# Patient Record
Sex: Male | Born: 1995 | Race: Black or African American | Hispanic: No | Marital: Single | Smoking: Never smoker
Health system: Southern US, Community
[De-identification: ages and names within clinical notes are randomized; demographics above are authoritative.]

## PROBLEM LIST (undated history)

## (undated) DIAGNOSIS — I10 Essential (primary) hypertension: Secondary | ICD-10-CM

## (undated) DIAGNOSIS — E119 Type 2 diabetes mellitus without complications: Secondary | ICD-10-CM

## (undated) DIAGNOSIS — J45909 Unspecified asthma, uncomplicated: Secondary | ICD-10-CM

## (undated) DIAGNOSIS — E669 Obesity, unspecified: Secondary | ICD-10-CM

## (undated) DIAGNOSIS — F329 Major depressive disorder, single episode, unspecified: Secondary | ICD-10-CM

## (undated) DIAGNOSIS — F32A Depression, unspecified: Secondary | ICD-10-CM

## (undated) HISTORY — PX: APPENDECTOMY: SHX54

## (undated) HISTORY — DX: Essential (primary) hypertension: I10

---
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
## 2013-01-03 ENCOUNTER — Encounter (HOSPITAL_COMMUNITY): Payer: Self-pay | Admitting: Physical Medicine and Rehabilitation

## 2013-01-03 ENCOUNTER — Emergency Department (HOSPITAL_COMMUNITY)
Admission: EM | Admit: 2013-01-03 | Discharge: 2013-01-03 | Disposition: A | Discharge status: Home / Self Care | Payer: Medicaid Other | Attending: Emergency Medicine | Admitting: Emergency Medicine

## 2013-01-03 ENCOUNTER — Emergency Department (HOSPITAL_COMMUNITY): Payer: Medicaid Other

## 2013-01-03 DIAGNOSIS — F329 Major depressive disorder, single episode, unspecified: Secondary | ICD-10-CM | POA: Insufficient documentation

## 2013-01-03 DIAGNOSIS — Z79899 Other long term (current) drug therapy: Secondary | ICD-10-CM | POA: Insufficient documentation

## 2013-01-03 DIAGNOSIS — F3289 Other specified depressive episodes: Secondary | ICD-10-CM | POA: Insufficient documentation

## 2013-01-03 DIAGNOSIS — R61 Generalized hyperhidrosis: Secondary | ICD-10-CM | POA: Insufficient documentation

## 2013-01-03 DIAGNOSIS — R0789 Other chest pain: Secondary | ICD-10-CM | POA: Insufficient documentation

## 2013-01-03 DIAGNOSIS — R42 Dizziness and giddiness: Secondary | ICD-10-CM | POA: Insufficient documentation

## 2013-01-03 DIAGNOSIS — J45909 Unspecified asthma, uncomplicated: Secondary | ICD-10-CM | POA: Insufficient documentation

## 2013-01-03 MED ORDER — METHOCARBAMOL 500 MG PO TABS: 500.0000 mg | ORAL_TABLET | Freq: Two times a day (BID) | ORAL | Status: DC

## 2013-01-03 MED ORDER — TRAMADOL HCL 50 MG PO TABS: 50.0000 mg | ORAL_TABLET | Freq: Four times a day (QID) | ORAL | Status: DC | PRN

## 2013-01-03 NOTE — ED Notes (Signed)
Pt states understanding of discharge instructions 

## 2013-01-03 NOTE — ED Notes (Signed)
Pt presents to department for evaluation of chest pain. States he was seen x2 weeks ago for same and diagnosed with muscle tear. 4/10 pain at the time, increases with movement. Pt states he returned to work earlier than expected and thinks that he re-injured muscle. Respirations unlabored. Pt is alert and oriented x4.

## 2013-01-03 NOTE — ED Provider Notes (Signed)
History  This chart was scribed for non-physician practitioner working with Flint Melter, MD by Erskine Emery, ED Scribe. This patient was seen in room TR09C/TR09C and the patient's care was started at 17:37.   CSN: [id number]  Arrival date & time 01/03/13  1648   First MD Initiated Contact with Patient 01/03/13 1737      Chief Complaint  Patient presents with  . Chest Pain    (Consider location/radiation/quality/duration/timing/severity/associated sxs/prior treatment) The history is provided by the patient. No language interpreter was used.  Jeremy Baker is a 17 y.o. male who presents to the Emergency Department complaining of worsening chest pain with a sharp throbbing pain that shoots across the chest and through the left arm for the past 2 weeks. Pt reports some associated labored breathing due to pain; he claims last night he could only take short breaths then got light headed. He also reports some associated diaphoresis, but denies any fevers, nausea, or emesis. Pt reports the pain is aggravated by moving his left shoulder. It is mildly relieved by drinking cold fluids. Pt was here 2 weeks ago for the same, after he was lifting a particularly heavy box at work, for [employer]. He was told to take ibuprofen 800s. Pt denies any previous injury of this shoulder before 2 weeks ago. 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   Pt has no PCP.  Past Medical History  Diagnosis Date  . Asthma   . Depression     Past Surgical History  Procedure Laterality Date  . Appendectomy      History reviewed. No pertinent family history.  History  Substance Use Topics  . Smoking status: Never Smoker   . Smokeless tobacco: Not on file  . Alcohol Use: No      Review of Systems  Constitutional: Positive for diaphoresis. Negative for fever and chills.  Respiratory: Positive for shortness of breath.   Cardiovascular: Positive for chest pain.  Gastrointestinal:  Negative for nausea and vomiting.  Neurological: Positive for light-headedness. Negative for weakness.  All other systems reviewed and are negative.    Allergies  Aspirin and Sertraline  Home Medications   Current Outpatient Rx  Name  Route  Sig  Dispense  Refill  . albuterol (PROVENTIL HFA;VENTOLIN HFA) 108 (90 BASE) MCG/ACT inhaler   Inhalation   Inhale 2 puffs into the lungs every 4 (four) hours as needed. For shorntess of breath         . citalopram (CELEXA) 10 MG/5ML suspension   Oral   Take 10 mg by mouth daily.         Marland Kitchen ibuprofen (ADVIL,MOTRIN) 800 MG tablet   Oral   Take 1 tablet (800 mg total) by mouth 3 (three) times daily.   21 tablet   0     Triage Vitals: BP 145/83  Pulse 72  Temp(Src) 98.8 F (37.1 C) (Oral)  Resp 16  SpO2 97%  Physical Exam  Nursing note and vitals reviewed. Constitutional: He is oriented to person, place, and time. He appears well-developed and well-nourished. No distress.  HENT:  Head: Normocephalic and atraumatic.  Right Ear: External ear normal.  Left Ear: External ear normal.  Nose: Nose normal.  Eyes: Conjunctivae are normal.  Neck: Normal range of motion. No tracheal deviation present.  Cardiovascular: Normal rate, regular rhythm and normal heart sounds.   Pulmonary/Chest: Effort normal and breath sounds normal. No stridor.  Tenderness to palpation over left chest and  shoulder.  Abdominal: Soft. He exhibits no distension. There is no tenderness.  Musculoskeletal: Normal range of motion.  Neurological: He is alert and oriented to person, place, and time.  Skin: Skin is warm and dry. He is not diaphoretic.  Psychiatric: He has a normal mood and affect. His behavior is normal.    ED Course  Procedures (including critical care time) DIAGNOSTIC STUDIES: Oxygen Saturation is 97% on room air, adequate by my interpretation.    COORDINATION OF CARE: 18:33-I evaluated the patient and we discussed a treatment plan  including chest x-ray to which the pt agreed.    Date: 01/04/2013  Rate: 73  Rhythm: normal sinus rhythm  QRS Axis: normal  Intervals: normal  ST/T Wave abnormalities: normal  Conduction Disutrbances:none  Narrative Interpretation:   Old EKG Reviewed: unchanged     Labs Reviewed  POCT I-STAT TROPONIN I   Dg Chest 2 View  01/03/2013  *RADIOLOGY REPORT*  Clinical Data: Chest pain and shortness of breath, asthma  CHEST - 2 VIEW  Comparison: 12/18/2012  Findings: Cardiomediastinal silhouette is within normal limits. The lungs are clear. No pleural effusion.  No pneumothorax.  No acute osseous abnormality.  IMPRESSION: Normal chest.   Original Report Authenticated By: Christiana Pellant, M.D.      1. Chest pain, musculoskeletal       MDM  Patient presents today with worsening chest pain over the past 2 weeks. He lifts heavy boxes for his job. He has been evaluated for this before. He does state that the chest pain is associated with shortness of breath, diaphoresis with radiation into his left arm. For this reason I did a cardiac workup on him which was grossly negative. PERC negative. This is a musculoskeletal chest pain. He you must be on light duty at work this week in order for your muscle strain to heal. Ice, NSAIDs. Given a prescription for Robaxin. Follow up with Ortho if your pain has not improved in a week. Resource to get in to establish care with PCP in community. Vital signs are stable for discharge. Return instructions given.Patient / Family / Caregiver informed of clinical course, understand medical decision-making process, and agree with plan.       I personally performed the services described in this documentation, which was scribed in my presence. The recorded information has been reviewed and is accurate.    Mora Bellman, PA-C 01/04/13 [phone]

## 2013-01-04 NOTE — ED Provider Notes (Signed)
Medical screening examination/treatment/procedure(s) were performed by non-physician practitioner and as supervising physician I was immediately available for consultation/collaboration.  Flint Melter, MD 01/04/13 [phone]

## 2013-01-05 ENCOUNTER — Telehealth (HOSPITAL_COMMUNITY): Payer: Self-pay | Admitting: *Deleted

## 2013-01-05 NOTE — ED Notes (Signed)
Pt needed lifting restrictions for light duty added to his work note.

## 2013-05-09 ENCOUNTER — Encounter (HOSPITAL_COMMUNITY): Payer: Self-pay | Admitting: *Deleted

## 2013-05-09 ENCOUNTER — Emergency Department (HOSPITAL_COMMUNITY)
Admission: EM | Admit: 2013-05-09 | Discharge: 2013-05-09 | Disposition: A | Discharge status: Home / Self Care | Payer: Self-pay | Attending: Emergency Medicine | Admitting: Emergency Medicine

## 2013-05-09 ENCOUNTER — Emergency Department (HOSPITAL_COMMUNITY): Payer: Self-pay

## 2013-05-09 DIAGNOSIS — F3289 Other specified depressive episodes: Secondary | ICD-10-CM | POA: Insufficient documentation

## 2013-05-09 DIAGNOSIS — E669 Obesity, unspecified: Secondary | ICD-10-CM | POA: Insufficient documentation

## 2013-05-09 DIAGNOSIS — J45909 Unspecified asthma, uncomplicated: Secondary | ICD-10-CM | POA: Insufficient documentation

## 2013-05-09 DIAGNOSIS — F329 Major depressive disorder, single episode, unspecified: Secondary | ICD-10-CM | POA: Insufficient documentation

## 2013-05-09 DIAGNOSIS — R0789 Other chest pain: Secondary | ICD-10-CM | POA: Insufficient documentation

## 2013-05-09 DIAGNOSIS — Z86718 Personal history of other venous thrombosis and embolism: Secondary | ICD-10-CM | POA: Insufficient documentation

## 2013-05-09 DIAGNOSIS — Z86711 Personal history of pulmonary embolism: Secondary | ICD-10-CM | POA: Insufficient documentation

## 2013-05-09 DIAGNOSIS — Z79899 Other long term (current) drug therapy: Secondary | ICD-10-CM | POA: Insufficient documentation

## 2013-05-09 NOTE — ED Notes (Signed)
Pt reports sudden onset of sharp/burning pain to center of chest. Does not radiate. Took pepcid without relief of pain. Denies any injuries or strenuous activities that would cause pain

## 2013-05-09 NOTE — ED Provider Notes (Signed)
CSN: [id number]     Arrival date & time 05/09/13  1712 History   First MD Initiated Contact with Patient 05/09/13 1720     Chief Complaint  Patient presents with  . Chest Pain   (Consider location/radiation/quality/duration/timing/severity/associated sxs/prior Treatment) HPI  Jeremy Baker is a 17 y.o. male with past medical history significant for obesity, asthma and depression coming in complaining of a sharp and burning pain to the center of his chest onset 5 days ago, patient has been taking Pepcid at home with little relief. Pain is rated at 8/10, it is intermittent but today it became much stronger and lasted much longer period patient denies family history of early cardiac death, history of hypertension, high cholesterol, smoking, shortness of breath, cough, abdominal pain, nausea vomiting, recent immobilizations, calf pain or swelling, history of DVT or PE.   Past Medical History  Diagnosis Date  . Asthma   . Depression    Past Surgical History  Procedure Laterality Date  . Appendectomy     No family history on file. History  Substance Use Topics  . Smoking status: Never Smoker   . Smokeless tobacco: Not on file  . Alcohol Use: No    Review of Systems 10 systems reviewed and found to be negative, except as noted in the HPI   Allergies  Aspirin and Sertraline  Home Medications   Current Outpatient Rx  Name  Route  Sig  Dispense  Refill  . albuterol (PROVENTIL HFA;VENTOLIN HFA) 108 (90 BASE) MCG/ACT inhaler   Inhalation   Inhale 2 puffs into the lungs every 4 (four) hours as needed. For shorntess of breath         . citalopram (CELEXA) 10 MG/5ML suspension   Oral   Take 10 mg by mouth daily.         . famotidine (PEPCID) 20 MG tablet   Oral   Take 20 mg by mouth 2 (two) times daily.          BP 137/81  Pulse 76  Temp(Src) 98.2 F (36.8 C) (Oral)  Resp 16  SpO2 97% Physical Exam  Nursing note and vitals reviewed. Constitutional: He is oriented  to person, place, and time. He appears well-developed and well-nourished. No distress.  Obese  HENT:  Head: Normocephalic.  Mouth/Throat: Oropharynx is clear and moist.  Eyes: Conjunctivae and EOM are normal.  Neck: Normal range of motion. No JVD present.  Cardiovascular: Normal rate, regular rhythm and intact distal pulses.   Pulmonary/Chest: Effort normal. No stridor. No respiratory distress. He has no wheezes. He has no rales. He exhibits tenderness.  Patient is very tender to palpation on the sternum  Abdominal: Soft. There is no tenderness.  Musculoskeletal: Normal range of motion. He exhibits no edema.  No calf asymmetry, superficial collaterals, palpable cords, edema, Homans sign negative bilaterally.    Neurological: He is alert and oriented to person, place, and time.  Psychiatric: He has a normal mood and affect.    ED Course  Procedures (including critical care time) Labs Review Labs Reviewed - No data to display Imaging Review Dg Chest 2 View  05/09/2013   *RADIOLOGY REPORT*  Clinical Data:  Chest pain and shortness of breath.  History of asthma and diabetes.  CHEST - 2 VIEW  Comparison: 01/03/2013.  Findings: The heart size and mediastinal contours are within normal limits.  Both lungs are clear.  The visualized skeletal structures are unremarkable.  IMPRESSION: No active disease.   Original Report  Authenticated By: Irish Lack, M.D.    Date: 05/09/2013  Rate: 87  Rhythm: normal sinus rhythm  QRS Axis: normal  Intervals: normal  ST/T Wave abnormalities: nonspecific T wave changes  Conduction Disutrbances:none  Narrative Interpretation:   Old EKG Reviewed: unchanged    MDM   1. Atypical chest pain     Filed Vitals:   05/09/13 1724 05/09/13 1811  BP: 127/75 137/81  Pulse: 91 76  Temp: 98.2 F (36.8 C)   TempSrc: Oral   Resp: 18 16  SpO2: 98% 97%     Jeremy Baker is a 17 y.o. male Patient is to be discharged with recommendation to follow up with  PCP in regards to today's hospital visit. Chest pain is not likely of cardiac or pulmonary etiology d/t presentation, perc negative, VSS, no tracheal deviation, no JVD or new murmur, RRR, breath sounds equal bilaterally, EKG without acute abnormalities, negative troponin, and negative CXR. Pt has been advised start a PPI and return to the ED is CP becomes exertional, associated with diaphoresis or nausea, radiates to left jaw/arm, worsens or becomes concerning in any way. Pt appears reliable for follow up and is agreeable to discharge. Pt verbalized understanding and agrees with care plan. All questions answered. Outpatient follow-up and specific return precautions discussed.    Note: Portions of this report may have been transcribed using voice recognition software. Every effort was made to ensure accuracy; however, inadvertent computerized transcription errors may be present      Wynetta Emery, PA-C 05/10/13 1716

## 2013-05-12 LAB — POCT I-STAT, CHEM 8
Calcium, Ion: 1.1 mmol/L — ABNORMAL LOW (ref 1.12–1.23)
Creatinine, Ser: 1.2 mg/dL (ref 0.50–1.35)
Hemoglobin: 15.6 g/dL (ref 13.0–17.0)
Sodium: 143 mEq/L (ref 135–145)
TCO2: 23 mmol/L (ref 0–100)

## 2013-05-12 LAB — POCT I-STAT TROPONIN I

## 2013-05-15 NOTE — ED Provider Notes (Signed)
Medical screening examination/treatment/procedure(s) were performed by non-physician practitioner and as supervising physician I was immediately available for consultation/collaboration. Devoria Albe, MD, FACEP   Ward Givens, MD 05/15/13 [phone]

## 2014-08-20 ENCOUNTER — Encounter (HOSPITAL_COMMUNITY): Payer: Self-pay | Admitting: Emergency Medicine

## 2014-08-20 ENCOUNTER — Emergency Department (HOSPITAL_COMMUNITY)
Admission: EM | Admit: 2014-08-20 | Discharge: 2014-08-21 | Disposition: A | Discharge status: Home / Self Care | Payer: No Typology Code available for payment source | Attending: Emergency Medicine | Admitting: Emergency Medicine

## 2014-08-20 DIAGNOSIS — F329 Major depressive disorder, single episode, unspecified: Secondary | ICD-10-CM | POA: Insufficient documentation

## 2014-08-20 DIAGNOSIS — E119 Type 2 diabetes mellitus without complications: Secondary | ICD-10-CM | POA: Insufficient documentation

## 2014-08-20 DIAGNOSIS — Z79899 Other long term (current) drug therapy: Secondary | ICD-10-CM | POA: Insufficient documentation

## 2014-08-20 DIAGNOSIS — M546 Pain in thoracic spine: Secondary | ICD-10-CM | POA: Insufficient documentation

## 2014-08-20 DIAGNOSIS — E669 Obesity, unspecified: Secondary | ICD-10-CM | POA: Insufficient documentation

## 2014-08-20 DIAGNOSIS — J45909 Unspecified asthma, uncomplicated: Secondary | ICD-10-CM | POA: Insufficient documentation

## 2014-08-20 HISTORY — DX: Obesity, unspecified: E66.9

## 2014-08-20 HISTORY — DX: Type 2 diabetes mellitus without complications: E11.9

## 2014-08-20 MED ORDER — MORPHINE SULFATE 4 MG/ML IJ SOLN
4.0000 mg | Freq: Once | INTRAMUSCULAR | Status: AC
  Administered 2014-08-20: 4 mg via INTRAMUSCULAR
  Filled 2014-08-20: qty 1

## 2014-08-20 MED ORDER — CYCLOBENZAPRINE HCL 10 MG PO TABS: 10.0000 mg | ORAL_TABLET | Freq: Two times a day (BID) | ORAL | Status: DC | PRN

## 2014-08-20 MED ORDER — DIAZEPAM 5 MG PO TABS
5.0000 mg | ORAL_TABLET | Freq: Once | ORAL | Status: AC
  Administered 2014-08-20: 5 mg via ORAL
  Filled 2014-08-20: qty 1

## 2014-08-20 MED ORDER — OXYCODONE-ACETAMINOPHEN 5-325 MG PO TABS
1.0000 | ORAL_TABLET | Freq: Once | ORAL | Status: AC
  Administered 2014-08-20: 1 via ORAL
  Filled 2014-08-20: qty 1

## 2014-08-20 MED ORDER — OXYCODONE-ACETAMINOPHEN 5-325 MG PO TABS: 1.0000 | ORAL_TABLET | Freq: Four times a day (QID) | ORAL | Status: DC | PRN

## 2014-08-20 NOTE — ED Notes (Signed)
Pt. reports low back pain radiating to mid back onset this evening while at work ( [employer] ) , denies injury or strenuous  activity , ambulatory / no urinary discomfort .

## 2014-08-20 NOTE — Discharge Instructions (Signed)
Back Pain, Adult Low back pain is very common. About 1 in 5 people have back pain.The cause of low back pain is rarely dangerous. The pain often gets better over time.About half of people with a sudden onset of back pain feel better in just 2 weeks. About 8 in 10 people feel better by 6 weeks.  CAUSES Some common causes of back pain include:  Strain of the muscles or ligaments supporting the spine.  Wear and tear (degeneration) of the spinal discs.  Arthritis.  Direct injury to the back. DIAGNOSIS Most of the time, the direct cause of low back pain is not known.However, back pain can be treated effectively even when the exact cause of the pain is unknown.Answering your caregiver's questions about your overall health and symptoms is one of the most accurate ways to make sure the cause of your pain is not dangerous. If your caregiver needs more information, he or she may order lab work or imaging tests (X-rays or MRIs).However, even if imaging tests show changes in your back, this usually does not require surgery. HOME CARE INSTRUCTIONS For many people, back pain returns.Since low back pain is rarely dangerous, it is often a condition that people can learn to manageon their own.   Remain active. It is stressful on the back to sit or stand in one place. Do not sit, drive, or stand in one place for more than 30 minutes at a time. Take short walks on level surfaces as soon as pain allows.Try to increase the length of time you walk each day.  Do not stay in bed.Resting more than 1 or 2 days can delay your recovery.  Do not avoid exercise or work.Your body is made to move.It is not dangerous to be active, even though your back may hurt.Your back will likely heal faster if you return to being active before your pain is gone.  Pay attention to your body when you bend and lift. Many people have less discomfortwhen lifting if they bend their knees, keep the load close to their bodies,and  avoid twisting. Often, the most comfortable positions are those that put less stress on your recovering back.  Find a comfortable position to sleep. Use a firm mattress and lie on your side with your knees slightly bent. If you lie on your back, put a pillow under your knees.  Only take over-the-counter or prescription medicines as directed by your caregiver. Over-the-counter medicines to reduce pain and inflammation are often the most helpful.Your caregiver may prescribe muscle relaxant drugs.These medicines help dull your pain so you can more quickly return to your normal activities and healthy exercise.  Put ice on the injured area.  Put ice in a plastic bag.  Place a towel between your skin and the bag.  Leave the ice on for 15-20 minutes, 03-04 times a day for the first 2 to 3 days. After that, ice and heat may be alternated to reduce pain and spasms.  Ask your caregiver about trying back exercises and gentle massage. This may be of some benefit.  Avoid feeling anxious or stressed.Stress increases muscle tension and can worsen back pain.It is important to recognize when you are anxious or stressed and learn ways to manage it.Exercise is a great option. SEEK MEDICAL CARE IF:  You have pain that is not relieved with rest or medicine.  You have pain that does not improve in 1 week.  You have new symptoms.  You are generally not feeling well. SEEK   IMMEDIATE MEDICAL CARE IF:   You have pain that radiates from your back into your legs.  You develop new bowel or bladder control problems.  You have unusual weakness or numbness in your arms or legs.  You develop nausea or vomiting.  You develop abdominal pain.  You feel faint. Document Released: 08/27/2005 Document Revised: 02/26/2012 Document Reviewed: 12/29/2013 Exit[employer] Patient Information 2015 [employer]. This information is not intended to replace advice given to you by your health care provider. Make sure you  discuss any questions you have with your health care provider.  

## 2014-08-20 NOTE — ED Provider Notes (Signed)
CSN: [id number]     Arrival date & time 08/20/14  2015 History  This chart was scribed for non-physician practitioner, Marlon Peliffany Krzysztof Reichelt, PA-C,working with Rolan BuccoMelanie Belfi, MD, by Karle PlumberJennifer Tensley, ED Scribe. This patient was seen in room TR07C/TR07C and the patient's care was started at 10:20 PM.  Chief Complaint  Patient presents with  . Back Pain   Patient is a 18 y.o. male presenting with back pain. The history is provided by the patient. No language interpreter was used.  Back Pain Associated symptoms: no numbness and no weakness     HPI Comments:  Jeremy Baker is a 18 y.o. obese male who presents to the Emergency Department complaining of new onset, severe lower back pain that began earlier today. He states the pain feels like an "electrical shock" that radiates from the lower back to his upper back. He reports the pain made him drop to his knees. He denies taking anything for the pain PTA. Pt states he fell on his hands and knees about two weeks ago but denies back pain secondary to that and has not had back pain like this before. Walking makes the pain worse. Denies alleviating factors. Denies heavy lifting, new shoes, numbness, tingling or weakness of the lower extremities. Denies trauma or injury. Maternal grandmother had a pace maker placed in her thirties due to heart failure. PMH of asthma, depression and DM. Reports allergy to ASA stating it thins his blood too much causing epistaxis.  Past Medical History  Diagnosis Date  . Asthma   . Depression   . Diabetes mellitus without complication   . Obesity    Past Surgical History  Procedure Laterality Date  . Appendectomy     No family history on file. History  Substance Use Topics  . Smoking status: Never Smoker   . Smokeless tobacco: Not on file  . Alcohol Use: No    Review of Systems  Gastrointestinal: Negative for nausea and vomiting.  Musculoskeletal: Positive for back pain.  Neurological: Negative for weakness and  numbness.  All other systems reviewed and are negative.   Allergies  Aspirin and Sertraline  Home Medications   Prior to Admission medications   Medication Sig Start Date End Date Taking? Authorizing Provider  citalopram (CELEXA) 10 MG tablet Take 10 mg by mouth daily.   Yes Historical Provider, MD  albuterol (PROVENTIL HFA;VENTOLIN HFA) 108 (90 BASE) MCG/ACT inhaler Inhale 2 puffs into the lungs every 4 (four) hours as needed for shortness of breath.     Historical Provider, MD  cyclobenzaprine (FLEXERIL) 10 MG tablet Take 1 tablet (10 mg total) by mouth 2 (two) times daily as needed for muscle spasms. 08/20/14   Reed Eifert Irine SealG Sargon Scouten, PA-C  oxyCODONE-acetaminophen (PERCOCET/ROXICET) 5-325 MG per tablet Take 1-2 tablets by mouth every 6 (six) hours as needed. 08/20/14   Dorthula Matasiffany G Korynn Kenedy, PA-C   Triage Vitals: BP 152/82 mmHg  Pulse 88  Temp(Src) 98.1 F (36.7 C) (Oral)  Resp 16  Ht 6\' 2"  (1.88 m)  Wt 365 lb 8 oz (165.79 kg)  BMI 46.91 kg/m2  SpO2 97% Physical Exam  Constitutional: He is oriented to person, place, and time. He appears well-developed and well-nourished.  HENT:  Head: Normocephalic and atraumatic.  Eyes: EOM are normal.  Neck: Normal range of motion.  Cardiovascular: Normal rate.   Pulmonary/Chest: Effort normal.  Musculoskeletal: Normal range of motion.       Arms: Pt has equal strength to bilateral lower extremities.  Neurosensory function  adequate to both legs No clonus on dorsiflextion Skin color is normal. Skin is warm and moist.  I see no step off deformity, no midline bony tenderness.  Pt is able to ambulate.  No crepitus, laceration, effusion, induration, lesions, swelling.   Pedal pulses are symmetrical and palpable bilaterally  Skin is very sensitive to touch. tenderness to palpation of paraspinel muscles   Neurological: He is alert and oriented to person, place, and time.  Skin: Skin is warm and dry.  Psychiatric: He has a normal mood and affect. His  behavior is normal.  Nursing note and vitals reviewed.   ED Course  Procedures (including critical care time) DIAGNOSTIC STUDIES: Oxygen Saturation is 97% on RA, normal by my interpretation.   COORDINATION OF CARE: 10:27 PM- Will order pain medication and muscle relaxer and reevaluate. Pt verbalizes understanding and agrees to plan.  Medications  diazepam (VALIUM) tablet 5 mg (5 mg Oral Given 08/20/14 2246)  morphine 4 MG/ML injection 4 mg (4 mg Intramuscular Given 08/20/14 2246)  oxyCODONE-acetaminophen (PERCOCET/ROXICET) 5-325 MG per tablet 1 tablet (1 tablet Oral Given 08/20/14 2354)    Labs Review Labs Reviewed - No data to display  Imaging Review No results found.   EKG Interpretation None      MDM   Final diagnoses:  Left-sided thoracic back pain   Medications  diazepam (VALIUM) tablet 5 mg (5 mg Oral Given 08/20/14 2246)  morphine 4 MG/ML injection 4 mg (4 mg Intramuscular Given 08/20/14 2246)  oxyCODONE-acetaminophen (PERCOCET/ROXICET) 5-325 MG per tablet 1 tablet (1 tablet Oral Given 08/20/14 2354)    The pain significantly improved the patients pain. He is not able to tolerate bending and his back muscles being touched. He is walking without a limp.  18 y.o.Jeremy Baker's  with back pain. No neurological deficits and normal neuro exam. Patient can walk. No loss of bowel or bladder control. No concern for cauda equina at this time base on HPI and physical exam findings. No fever, night sweats, weight loss, h/o cancer, IVDU.  The patient has no numbness or tingling to lower legs, no weakness. No midline tenderness, it lateralizes to the left.  RICE protocol and pain medicine indicated and discussed with patient.   Patient Plan 1. Medications: pain medication and muscle relaxer. Cont usual home medications unless otherwise directed. 2. Treatment: rest, drink plenty of fluids, gentle stretching as discussed, alternate ice and heat  3. Follow Up: Please  followup with your primary doctor for discussion of your diagnoses and further evaluation after today's visit; if you do not have a primary care doctor use the resource guide provided to find one  Advised to follow-up with the orthopedist if symptoms do not start to resolve in the next 2-3 days. If develop loss of bowel or urinary control return to the ED as soon as possible for further evaluation. To take the medications as prescribed as they can cause harm if not taken appropriately.   Vital signs are stable at discharge. Filed Vitals:   08/20/14 2358  BP: 134/88  Pulse: 83  Temp: 98 F (36.7 C)  Resp: 18    Patient/guardian has voiced understanding and agreed to follow-up with the PCP or specialist.       I personally performed the services described in this documentation, which was scribed in my presence. The recorded information has been reviewed and is accurate.    Dorthula Matasiffany G Cylis Ayars, PA-C 08/23/14 1421  Rolan BuccoMelanie Belfi, MD 08/23/14 [phone]

## 2017-09-01 ENCOUNTER — Encounter (HOSPITAL_COMMUNITY): Payer: Self-pay | Admitting: Emergency Medicine

## 2017-09-01 ENCOUNTER — Other Ambulatory Visit: Payer: Self-pay

## 2017-09-01 ENCOUNTER — Emergency Department (HOSPITAL_COMMUNITY): Payer: Managed Care, Other (non HMO)

## 2017-09-01 ENCOUNTER — Emergency Department (HOSPITAL_COMMUNITY)
Admission: EM | Admit: 2017-09-01 | Discharge: 2017-09-01 | Disposition: A | Discharge status: Home / Self Care | Payer: Managed Care, Other (non HMO) | Attending: Emergency Medicine | Admitting: Emergency Medicine

## 2017-09-01 DIAGNOSIS — R079 Chest pain, unspecified: Secondary | ICD-10-CM | POA: Insufficient documentation

## 2017-09-01 DIAGNOSIS — J45909 Unspecified asthma, uncomplicated: Secondary | ICD-10-CM | POA: Insufficient documentation

## 2017-09-01 DIAGNOSIS — Z79899 Other long term (current) drug therapy: Secondary | ICD-10-CM | POA: Diagnosis not present

## 2017-09-01 DIAGNOSIS — R0789 Other chest pain: Secondary | ICD-10-CM

## 2017-09-01 LAB — CBC
HCT: 44 % (ref 39.0–52.0)
Hemoglobin: 14.2 g/dL (ref 13.0–17.0)
MCH: 27 pg (ref 26.0–34.0)
MCHC: 32.3 g/dL (ref 30.0–36.0)
MCV: 83.7 fL (ref 78.0–100.0)
Platelets: 257 10*3/uL (ref 150–400)
RBC: 5.26 MIL/uL (ref 4.22–5.81)
RDW: 13.6 % (ref 11.5–15.5)
WBC: 8 10*3/uL (ref 4.0–10.5)

## 2017-09-01 LAB — BASIC METABOLIC PANEL
Anion gap: 8 (ref 5–15)
BUN: 9 mg/dL (ref 6–20)
CHLORIDE: 110 mmol/L (ref 101–111)
CO2: 22 mmol/L (ref 22–32)
Calcium: 9 mg/dL (ref 8.9–10.3)
Creatinine, Ser: 1.08 mg/dL (ref 0.61–1.24)
GFR calc Af Amer: >60 mL/min (ref >60)
GFR calc non Af Amer: >60 mL/min (ref >60)
Glucose, Bld: 97 mg/dL (ref 65–99)
Potassium: 3.6 mmol/L (ref 3.5–5.1)
Sodium: 140 mmol/L (ref 135–145)

## 2017-09-01 LAB — I-STAT TROPONIN, ED
Troponin i, poc: 0 ng/mL (ref 0.00–0.08)
Troponin i, poc: 0 ng/mL (ref 0.00–0.08)

## 2017-09-01 MED ORDER — ACETAMINOPHEN 500 MG PO TABS
1000.0000 mg | ORAL_TABLET | Freq: Once | ORAL | Status: AC
  Administered 2017-09-01: 1000 mg via ORAL
  Filled 2017-09-01: qty 2

## 2017-09-01 MED ORDER — IPRATROPIUM-ALBUTEROL 0.5-2.5 (3) MG/3ML IN SOLN
3.0000 mL | Freq: Once | RESPIRATORY_TRACT | Status: AC
  Administered 2017-09-01: 3 mL via RESPIRATORY_TRACT
  Filled 2017-09-01: qty 3

## 2017-09-01 NOTE — ED Triage Notes (Signed)
Ems- Pt reports chx pain to the center of chest, sharp in nature with occasional radiation to the shoulder. No ASA given due to allergy. 2 nitro PTA. Vitals stable. NSR with abnormalities. NAD at triage.

## 2017-09-01 NOTE — ED Provider Notes (Signed)
[facility] EMERGENCY DEPARTMENT Provider Note   CSN: [id number] Arrival date & time: 09/01/17  1342     History   Chief Complaint Chief Complaint  Patient presents with  . Chest Pain    HPI Jeremy Baker is a 21 y.o. male w/ h/o childhood asthma, obesity, pre-diabetes, borderline HTN presents for evaluation of sudden, constant, sharp, substernal chest pain that began 2 hours ago while he was working at [employer]. Radiating to epigastric area. Associated with shortness of breath that he describes as "pain took my breath away", but this has resolved. Also having a dry cough x 2 days with itchy throat, thinks he is getting a cold. Given nitro x 2 via EMS which minimally reduced his CP from 7/10 to 5/10. Reports h/o previous "muscle tears" across his chest from shoulder to shoulder from previous trauma but today's symptoms are different.   No nausea, vomiting, diaphoresis, palpitations, LE edema, h/o DVT/PE. No tobacco, ETOH or illicit drug use. No family h/o young onset CAD.   HPI  Past Medical History:  Diagnosis Date  . Asthma   . Depression   . Obesity     There are no active problems to display for this patient.   Past Surgical History:  Procedure Laterality Date  . APPENDECTOMY         Home Medications    Prior to Admission medications   Medication Sig Start Date End Date Taking? Authorizing Provider  albuterol (PROVENTIL HFA;VENTOLIN HFA) 108 (90 BASE) MCG/ACT inhaler Inhale 2 puffs into the lungs every 4 (four) hours as needed for shortness of breath.    Yes [provider]  lamoTRIgine (LAMICTAL) 25 MG tablet Take 25 mg by mouth daily. 06/14/16  Yes [provider]  topiramate (TOPAMAX) 50 MG tablet Take 100 mg by mouth daily.  06/14/16  Yes [provider]  citalopram (CELEXA) 10 MG tablet Take 10 mg by mouth daily.    [provider]  cyclobenzaprine (FLEXERIL) 10 MG tablet Take 1 tablet  (10 mg total) by mouth 2 (two) times daily as needed for muscle spasms. Patient not taking: Reported on 09/01/2017 08/20/14   Marlon Pel, PA-C  oxyCODONE-acetaminophen (PERCOCET/ROXICET) 5-325 MG per tablet Take 1-2 tablets by mouth every 6 (six) hours as needed. Patient not taking: Reported on 09/01/2017 08/20/14   Marlon Pel, PA-C    Family History No family history on file.  Social History Social History   Tobacco Use  . Smoking status: Never Smoker  Substance Use Topics  . Alcohol use: No  . Drug use: No     Allergies   Aspirin and Sertraline   Review of Systems Review of Systems  Respiratory: Positive for cough, chest tightness and shortness of breath (resolved).   Cardiovascular: Positive for chest pain.  All other systems reviewed and are negative.    Physical Exam Updated Vital Signs BP 131/70 (BP Location: Right Arm)   Pulse 72   Temp 98.5 F (36.9 C) (Oral)   Resp 16   SpO2 99%   Physical Exam  Constitutional: He appears well-developed and well-nourished.  Obese male. NAD. Non toxic.   HENT:  Head: Normocephalic and atraumatic.  Nose: Nose normal.  Moist mucous membranes Tonsils and oropharynx normal  Eyes: Conjunctivae, EOM and lids are normal.  Neck: Trachea normal and normal range of motion.  Neck is supple Trachea midline No cervical adenopathy  Cardiovascular: Normal rate, regular rhythm, S1 normal, S2 normal and  normal heart sounds.  Pulses:      Carotid pulses are 2+ on the right side, and 2+ on the left side.      Radial pulses are 2+ on the right side, and 2+ on the left side.       Dorsalis pedis pulses are 2+ on the right side, and 2+ on the left side.  RRR No S3 No orthopnea No LE edema No carotid bruits  Pulmonary/Chest: Effort normal and breath sounds normal. No respiratory distress. He has no decreased breath sounds. He has no wheezes. He has no rhonchi. He has no rales. He exhibits tenderness.  +Central, sternal  chest wall tenderness. Reproducible with AROM of upper extremities against resistance.     Abdominal: Soft. Bowel sounds are normal. There is no tenderness.  No epigastric tenderness  Lymphadenopathy:    He has no cervical adenopathy.  Neurological: He is alert. GCS eye subscore is 4. GCS verbal subscore is 5. GCS motor subscore is 6.  Skin: Skin is warm and dry. Capillary refill takes less than 2 seconds.  Psychiatric: He has a normal mood and affect. His speech is normal and behavior is normal. Judgment and thought content normal. Cognition and memory are normal.     ED Treatments / Results  Labs (all labs ordered are listed, but only abnormal results are displayed) Labs Reviewed  BASIC METABOLIC PANEL  CBC  I-STAT TROPONIN, ED  I-STAT TROPONIN, ED    EKG  EKG Interpretation None       Radiology Dg Chest 2 View  Result Date: 09/01/2017 CLINICAL DATA:  Pt reports he was at work today and began having a sharp, burning pain in the center of his chest and some shortness of breath. Hx of asthma as a child. Non-smoker EXAM: CHEST  2 VIEW COMPARISON:  05/09/2013 FINDINGS: The heart size and mediastinal contours are within normal limits. Both lungs are clear. No pleural effusion or pneumothorax. The visualized skeletal structures are unremarkable. IMPRESSION: Normal chest radiographs. Electronically Signed   By: Amie Portlandavid  Ormond M.D.   On: 09/01/2017 14:38    Procedures Procedures (including critical care time)  Medications Ordered in ED Medications  ipratropium-albuterol (DUONEB) 0.5-2.5 (3) MG/3ML nebulizer solution 3 mL (3 mLs Nebulization Given 09/01/17 1519)  acetaminophen (TYLENOL) tablet 1,000 mg (1,000 mg Oral Given 09/01/17 1518)     Initial Impression / Assessment and Plan / ED Course  I have reviewed the triage vital signs and the nursing notes.  Pertinent labs & imaging results that were available during my care of the patient were reviewed by me and considered in  my medical decision making (see chart for details).    Pt is a 21 y.o. male presents with CP described as sharp and constant. Non exertional, non pleuritic and non positional. It is reproducible with palpation. Slightly improved with nitro, but more responsive to tylenol and duoneb.  Pertinent risk factors include obesity and pre-diabetes.  On exam VS are wnl. Cardiovascular and pulmonary exam benign. CXR, EKG, troponin x 2 within normal limits.  CBC and BMP unremarkable.  PERC negative. Heart score = 2-3 based on risk factors.  Patient has ambulated and tolerated PO in ED. Given symptoms, reassuring ED work up, low risk HEART score patient will be discharged with recommendation to follow up with PCP and cardiologist in regards to today's hospital visit and atypical CP. ED return preacutions given. Pt appears reliable for follow up and is agreeable to discharge.   Final  Clinical Impressions(s) / ED Diagnoses   Final diagnoses:  Atypical chest pain    ED Discharge Orders    None       Jerrell MylarGibbons, Tinie Mcgloin J, PA-C 09/01/17 1819    Benjiman CorePickering, Nathan, MD 09/02/17 2018

## 2017-09-01 NOTE — ED Notes (Signed)
Patient verbalized understanding of discharge instructions and denies any further needs or questions at this time. VS stable. Patient ambulatory with steady gait.  

## 2017-09-01 NOTE — Discharge Instructions (Signed)
Your labs, chest x-ray, EKG and heart enzymes were ok.   You have some risk factors for early onset heart disease including weight, pre-diabetes, pre-high blood pressure. You need to follow up with a cardiologist for further outpatient work up.  Return to emergency department if you develop exertional chest pain or shortness of breath, palpitations, nausea, vomiting, light-headedness, fevers, cough

## 2017-10-15 ENCOUNTER — Ambulatory Visit (INDEPENDENT_AMBULATORY_CARE_PROVIDER_SITE_OTHER): Payer: Managed Care, Other (non HMO) | Admitting: Cardiovascular Disease

## 2017-10-15 ENCOUNTER — Encounter: Payer: Self-pay | Admitting: Cardiovascular Disease

## 2017-10-15 VITALS — BP 132/75 | HR 77 | Resp 16 | Ht 76.0 in | Wt 377.0 lb

## 2017-10-15 DIAGNOSIS — Z13220 Encounter for screening for lipoid disorders: Secondary | ICD-10-CM

## 2017-10-15 DIAGNOSIS — I10 Essential (primary) hypertension: Secondary | ICD-10-CM | POA: Diagnosis not present

## 2017-10-15 DIAGNOSIS — R0789 Other chest pain: Secondary | ICD-10-CM

## 2017-10-15 DIAGNOSIS — Z79899 Other long term (current) drug therapy: Secondary | ICD-10-CM

## 2017-10-15 DIAGNOSIS — Z1329 Encounter for screening for other suspected endocrine disorder: Secondary | ICD-10-CM

## 2017-10-15 DIAGNOSIS — Z7189 Other specified counseling: Secondary | ICD-10-CM

## 2017-10-15 NOTE — Patient Instructions (Signed)
Medication Instructions:  Your physician recommends that you continue on your current medications as directed. Please refer to the Current Medication list given to you today.  Labwork: Please return for FASTING labs (CMET, Lipid, TSH)-lab orders provided.  Testing/Procedures: Your physician has requested that you have an exercise tolerance test. For further information please visit [email or web address]. Please also follow instruction sheet, as given.  Your physician has requested that you have an echocardiogram. Echocardiography is a painless test that uses sound waves to create images of your heart. It provides your doctor with information about the size and shape of your heart and how well your heart's chambers and valves are working. This procedure takes approximately one hour. There are no restrictions for this procedure.  [facility]  [facility]  Follow-Up: 3 months with Dr. Tresa EndoKelly.   Any Other Special Instructions Will Be Listed Below (If Applicable).     If you need a refill on your cardiac medications before your next appointment, please call your pharmacy.

## 2017-10-15 NOTE — Progress Notes (Signed)
Cardiology Office Note    Date:  10/20/2017   ID:  Jeremy Baker, DOB 1996-08-30, MRN [id number]  PCP:  Patient, No Pcp Per  Cardiologist:  Shelva Majestic, MD   Chief Complaint  Patient presents with  . New Patient (Initial Visit)    chest pain   Cardiology consultation, referred through the [facility] emergency room regarding chest pain  History of Present Illness:  Jeremy Baker is a 22 y.o. male who is referred for cardiology evaluation after a recent coronary emergency room evaluation for chest pain.  Jeremy Baker has a long-standing history of morbid obesity.  He tells me that his peak weight was 420 pounds.  He works at [employer] and before Christmas he was working very hard lifting, moving, and started to develop persistent sharp, stabbing like chest pain.  He denied any definitive chest pain, development with walking but noted this when he was exceptionally busy while at work.  He presented to the emergency room on 09/01/2017 for evaluation of a "sudden, constant, sharp, substernal chest pain that began 2 hours previously, while working, Arts administrator at [employer].  "The pain radiated to his epigastric area and was associated with mild shortness of breath, which took his breath away for short duration.  He also admitted to having a dry cough for several days.  He was given several nitroglycerin by EMS with plus minus benefit.  He had reported previous "muscle tears "across his chest from shoulder to shoulder from previous trauma, but those symptoms were different.  His chest pain was felt to be atypical.  However, he is now referred for cardiology evaluation.  The patient states that he has felt better with reference to this discomfort.  He has lost approximately 30 pounds by reducing his sugar intake in sodas.  He does not routinely exercise.  There is family history for diabetes mellitus in his father had developed kidney failure.  He presents for evaluation.   Past Medical  History:  Diagnosis Date  . Asthma   . Depression   . Diabetes mellitus without complication ([place])   . Hypertension   . Obesity     Past Surgical History:  Procedure Laterality Date  . APPENDECTOMY      Current Medications: Outpatient Medications Prior to Visit  Medication Sig Dispense Refill  . lamoTRIgine (LAMICTAL) 25 MG tablet Take 25 mg by mouth daily.    Marland Kitchen topiramate (TOPAMAX) 50 MG tablet Take 100 mg by mouth daily.     Marland Kitchen albuterol (PROVENTIL HFA;VENTOLIN HFA) 108 (90 BASE) MCG/ACT inhaler Inhale 2 puffs into the lungs every 4 (four) hours as needed for shortness of breath.     . citalopram (CELEXA) 10 MG tablet Take 10 mg by mouth daily.    . cyclobenzaprine (FLEXERIL) 10 MG tablet Take 1 tablet (10 mg total) by mouth 2 (two) times daily as needed for muscle spasms. (Patient not taking: Reported on 09/01/2017) 20 tablet 0  . oxyCODONE-acetaminophen (PERCOCET/ROXICET) 5-325 MG per tablet Take 1-2 tablets by mouth every 6 (six) hours as needed. (Patient not taking: Reported on 09/01/2017) 25 tablet 0   No facility-administered medications prior to visit.      Allergies:   Aspirin and Sertraline   Social History   Socioeconomic History  . Marital status: Single    Spouse name: None  . Number of children: None  . Years of education: None  . Highest education level: None  Social Needs  . Financial resource strain: None  .  Food insecurity - worry: None  . Food insecurity - inability: None  . Transportation needs - medical: None  . Transportation needs - non-medical: None  Occupational History  . None  Tobacco Use  . Smoking status: Never Smoker  . Smokeless tobacco: Never Used  Substance and Sexual Activity  . Alcohol use: No  . Drug use: No  . Sexual activity: None  Other Topics Concern  . None  Social History Narrative  . None     Family History:  The patient's  family history includes Cancer in his maternal grandfather; Diabetes in his father and  mother; Heart disease in his maternal grandmother and mother; Hypertension in his mother; Kidney failure in his father and paternal grandfather.   ROS General: Negative; No fevers, chills, or night sweats; morbid obesity HEENT: Negative; No changes in vision or hearing, sinus congestion, difficulty swallowing Pulmonary: Negative; No cough, wheezing, shortness of breath, hemoptysis Cardiovascular: No exertional chest pain symptoms.  No PND, orthopnea.  No presyncope or syncope. GI: Negative; No nausea, vomiting, diarrhea, or abdominal pain GU: Negative; No dysuria, hematuria, or difficulty voiding Musculoskeletal: Negative; no myalgias, joint pain, or weakness Hematologic/Oncology: Negative; no easy bruising, bleeding Endocrine: Negative; no heat/cold intolerance; no diabetes Neuro: Negative; no changes in balance, headaches Skin: Negative; No rashes or skin lesions Psychiatric: Negative; No behavioral problems, depression Sleep: Negative; No snoring, daytime sleepiness, hypersomnolence, bruxism, restless legs, hypnogognic hallucinations, no cataplexy Other comprehensive 14 point system review is negative.   PHYSICAL EXAM:   VS:  BP 132/75   Pulse 77   Resp 16   Ht '6\' 4"'  (1.93 m)   Wt (!) 377 lb (171 kg)   SpO2 98%   BMI 45.89 kg/m     Repeat blood pressure by me was 122/76  Wt Readings from Last 3 Encounters:  10/15/17 (!) 377 lb (171 kg)  08/20/14 (!) 365 lb 8 oz (165.8 kg)  11/20/11 (!) 335 lb 1.6 oz (152 kg) (>99 %, Z= 3.39)*   * Growth percentiles are based on CDC (Boys, 2-20 Years) data.    General: Alert, oriented, no distress.  Skin: normal turgor, no rashes, warm and dry HEENT: Normocephalic, atraumatic. Pupils equal round and reactive to light; sclera anicteric; extraocular muscles intact; Fundi normal Nose without nasal septal hypertrophy Mouth/Parynx benign; Mallinpatti scale 3 Neck: Thick neck; No JVD, no carotid bruits; normal carotid upstroke Lungs: clear to  ausculatation and percussion; no wheezing or rales Chest wall: without tenderness to palpitation Heart: PMI not displaced, RRR, s1 s2 normal, 1/6 systolic murmur, no diastolic murmur, no rubs, gallops, thrills, or heaves Abdomen: Significant central adiposity soft, nontender; no hepatosplenomehaly, BS+; abdominal aorta nontender and not dilated by palpation. Back: no CVA tenderness Pulses 2+ Musculoskeletal: full range of motion, normal strength, no joint deformities Extremities: no clubbing cyanosis or edema, Homan's sign negative  Neurologic: grossly nonfocal; Cranial nerves grossly wnl Psychologic: Normal mood and affect   Studies/Labs Reviewed:   EKG:  EKG is ordered today.  ECG (independently read by me): Normal sinus rhythm at 69 bpm.  Nonspecific T-wave abnormality.  PR interval 138 ms.  QTc interval 390 ms.  Recent Labs: BMP Latest Ref Rng & Units 09/01/2017 05/09/2013 05/18/2008  Glucose 65 - 99 mg/dL 97 86 73  BUN 6 - 20 mg/dL '9 12 7  ' Creatinine 0.61 - 1.24 mg/dL 1.08 1.20 0.84  Sodium 135 - 145 mmol/L 140 143 143  Potassium 3.5 - 5.1 mmol/L 3.6 3.8 3.6  Chloride 101 - 111 mmol/L 110 109 107  CO2 22 - 32 mmol/L 22 - 28  Calcium 8.9 - 10.3 mg/dL 9.0 - 9.8     Hepatic Function 05/18/2008  Total Protein 7.3  Albumin 4.1  AST 21  ALT 19  Alk Phosphatase 186  Total Bilirubin 0.6    CBC Latest Ref Rng & Units 09/01/2017 05/09/2013 05/18/2008  WBC 4.0 - 10.5 K/uL 8.0 - 7.1  Hemoglobin 13.0 - 17.0 g/dL 14.2 15.6 14.8(H)  Hematocrit 39.0 - 52.0 % 44.0 46.0 46.3(H)  Platelets 150 - 400 K/uL 257 - 318   Lab Results  Component Value Date   MCV 83.7 09/01/2017   MCV 81.3 05/18/2008   No results found for: TSH No results found for: HGBA1C   BNP No results found for: BNP  ProBNP No results found for: PROBNP   Lipid Panel  No results found for: CHOL, TRIG, HDL, CHOLHDL, VLDL, LDLCALC, LDLDIRECT   RADIOLOGY: No results found.   Additional studies/ records that  were reviewed today include:  I reviewed the patient's emergency room evaluation , ECG and laboratory.    ASSESSMENT:    1. Atypical chest pain   2. Lipid screening   3. Medication management   4. Hypertension, unspecified type   5. Thyroid disorder screen   6. Morbid obesity ([place])   7. Counseling on health promotion and disease prevention      PLAN:  Jeremy Baker is a very pleasant 22 year old morbidly obese African-American male who presents to the office today after developing sharp, sudden chest pain while he was very busy at work during the Christmas season.  His chest pain was not definitive.  Lee relieved with supper.  We will nitroglycerin.  I suspect his chest pain is chest wall pain/musculoskeletal, and not representative of an ischemic etiology.  However, I spent over 40 minutes with the patient and his mother today in the office try to make an impact on lifestyle adjustment in this young gentleman.  He admits to a long-standing history of being morbidly obese and actually weighed 420 pounds.  His senior year of high school.  I discussed the implications associated with long-standing obesity, both with reference to future.  Diabetes mellitus, development, dental for obstructive sleep apnea, as well as potential future orthopedic issues.  Presently, his blood pressure is stable, but he will be at increased risk for hypertension, development.  I discussed with him current American Heart Association guidelines with reference to lifestyle and particularly with reference to exercise.  He works long hours and how her [place].  However, a she does not go to work until later in the day where as other days he gets into work early, but comes out not to leg.  I have suggested that he exercise a minimum of 5 days per week.  I discussed the importance of diet and specifically discussed the benefits of the Mediterranean diet.  I'm scheduling him for an echo Doppler study to make certain there is  no underlying structural heart disease.  I'm also recommending he undergo a routine graded exercise treadmill test which we helpful both for reassurance and for exercise prescription.  Chemistry, TSH, and lipid studies will be done in the fasting state.  We discussed the importance of a weight goal and initially trying to lose perhaps 2 pounds per week if at all possible.  I will see him in the office in 3 months for follow-up evaluation and further recommendations will be  made at that time.   Medication Adjustments/Labs and Tests Ordered: Current medicines are reviewed at length with the patient today.  Concerns regarding medicines are outlined above.  Medication changes, Labs and Tests ordered today are listed in the Patient Instructions below. Patient Instructions  Medication Instructions:  Your physician recommends that you continue on your current medications as directed. Please refer to the Current Medication list given to you today.  Labwork: Please return for FASTING labs (CMET, Lipid, TSH)-lab orders provided.  Testing/Procedures: Your physician has requested that you have an exercise tolerance test. For further information please visit [email or web address]. Please also follow instruction sheet, as given.  Your physician has requested that you have an echocardiogram. Echocardiography is a painless test that uses sound waves to create images of your heart. It provides your doctor with information about the size and shape of your heart and how well your heart's chambers and valves are working. This procedure takes approximately one hour. There are no restrictions for this procedure.  This will be done at our [facility] location:  [place]: 3 months with Dr. Claiborne Billings.   Any Other Special Instructions Will Be Listed Below (If Applicable).     If you need a refill on your cardiac medications before your next appointment, please call your pharmacy.        Signed, Shelva Majestic, MD  10/20/2017 11:57 AM    [facility] [address] [place], [place], [place]  [address] Phone: [phone]

## 2017-10-20 ENCOUNTER — Encounter: Payer: Self-pay | Admitting: Cardiovascular Disease

## 2017-10-29 ENCOUNTER — Ambulatory Visit (HOSPITAL_COMMUNITY): Payer: Managed Care, Other (non HMO) | Attending: Cardiology

## 2017-10-29 ENCOUNTER — Telehealth (HOSPITAL_COMMUNITY): Payer: Self-pay

## 2017-10-29 ENCOUNTER — Other Ambulatory Visit: Payer: Self-pay

## 2017-10-29 DIAGNOSIS — J45909 Unspecified asthma, uncomplicated: Secondary | ICD-10-CM | POA: Diagnosis not present

## 2017-10-29 DIAGNOSIS — R0789 Other chest pain: Secondary | ICD-10-CM | POA: Insufficient documentation

## 2017-10-29 DIAGNOSIS — I10 Essential (primary) hypertension: Secondary | ICD-10-CM | POA: Diagnosis not present

## 2017-10-29 DIAGNOSIS — E119 Type 2 diabetes mellitus without complications: Secondary | ICD-10-CM | POA: Insufficient documentation

## 2017-10-29 DIAGNOSIS — Z6841 Body Mass Index (BMI) 40.0 and over, adult: Secondary | ICD-10-CM | POA: Diagnosis not present

## 2017-10-29 DIAGNOSIS — E669 Obesity, unspecified: Secondary | ICD-10-CM | POA: Insufficient documentation

## 2017-10-29 NOTE — Telephone Encounter (Signed)
Encounter complete. 

## 2017-10-31 ENCOUNTER — Ambulatory Visit (HOSPITAL_COMMUNITY)
Admission: RE | Admit: 2017-10-31 | Discharge: 2017-10-31 | Disposition: A | Discharge status: Home / Self Care | Payer: Managed Care, Other (non HMO) | Source: Ambulatory Visit | Attending: Cardiology | Admitting: Cardiology

## 2017-10-31 DIAGNOSIS — R0789 Other chest pain: Secondary | ICD-10-CM

## 2017-10-31 LAB — EXERCISE TOLERANCE TEST
CSEPEW: 10.1 METS
Exercise duration (min): 7 min
Exercise duration (sec): 56 s
MPHR: 197 {beats}/min
Peak HR: 196 {beats}/min
Percent HR: 99 %
RPE: 18
Rest HR: 75 {beats}/min

## 2018-01-14 ENCOUNTER — Ambulatory Visit: Payer: Managed Care, Other (non HMO) | Admitting: Cardiovascular Disease

## 2018-03-20 ENCOUNTER — Emergency Department (HOSPITAL_COMMUNITY): Payer: Managed Care, Other (non HMO)

## 2018-03-20 ENCOUNTER — Emergency Department (HOSPITAL_COMMUNITY)
Admission: EM | Admit: 2018-03-20 | Discharge: 2018-03-21 | Disposition: A | Discharge status: Home / Self Care | Payer: Managed Care, Other (non HMO) | Attending: Emergency Medicine | Admitting: Emergency Medicine

## 2018-03-20 ENCOUNTER — Other Ambulatory Visit: Payer: Self-pay

## 2018-03-20 ENCOUNTER — Encounter (HOSPITAL_COMMUNITY): Payer: Self-pay | Admitting: Emergency Medicine

## 2018-03-20 DIAGNOSIS — E119 Type 2 diabetes mellitus without complications: Secondary | ICD-10-CM | POA: Insufficient documentation

## 2018-03-20 DIAGNOSIS — R0789 Other chest pain: Secondary | ICD-10-CM | POA: Insufficient documentation

## 2018-03-20 DIAGNOSIS — J45909 Unspecified asthma, uncomplicated: Secondary | ICD-10-CM | POA: Diagnosis not present

## 2018-03-20 DIAGNOSIS — I10 Essential (primary) hypertension: Secondary | ICD-10-CM | POA: Insufficient documentation

## 2018-03-20 DIAGNOSIS — Z79899 Other long term (current) drug therapy: Secondary | ICD-10-CM | POA: Diagnosis not present

## 2018-03-20 LAB — BASIC METABOLIC PANEL
ANION GAP: 8 (ref 5–15)
BUN: 11 mg/dL (ref 6–20)
CO2: 27 mmol/L (ref 22–32)
Calcium: 9.1 mg/dL (ref 8.9–10.3)
Chloride: 108 mmol/L (ref 98–111)
Creatinine, Ser: 1.2 mg/dL (ref 0.61–1.24)
GFR calc Af Amer: >60 mL/min (ref >60)
GFR calc non Af Amer: >60 mL/min (ref >60)
Glucose, Bld: 97 mg/dL (ref 70–99)
Potassium: 3.8 mmol/L (ref 3.5–5.1)
Sodium: 143 mmol/L (ref 135–145)

## 2018-03-20 LAB — CBC
HEMATOCRIT: 50.5 % (ref 39.0–52.0)
Hemoglobin: 15.4 g/dL (ref 13.0–17.0)
MCH: 26.3 pg (ref 26.0–34.0)
MCHC: 30.5 g/dL (ref 30.0–36.0)
MCV: 86.3 fL (ref 78.0–100.0)
Platelets: 283 10*3/uL (ref 150–400)
RBC: 5.85 MIL/uL — ABNORMAL HIGH (ref 4.22–5.81)
RDW: 13.3 % (ref 11.5–15.5)
WBC: 9.1 10*3/uL (ref 4.0–10.5)

## 2018-03-20 LAB — I-STAT TROPONIN, ED: Troponin i, poc: 0 ng/mL (ref 0.00–0.08)

## 2018-03-20 MED ORDER — IBUPROFEN 800 MG PO TABS
800.0000 mg | ORAL_TABLET | Freq: Once | ORAL | Status: AC
  Administered 2018-03-21: 800 mg via ORAL
  Filled 2018-03-20: qty 1

## 2018-03-20 NOTE — ED Triage Notes (Signed)
Pt reports centralized chest "pressure" that comes and goes X3 days -  moves to his left shoulder and down his arm.  Pt reports he gets clammy and light headed when this happens.

## 2018-03-20 NOTE — ED Provider Notes (Signed)
[facility] EMERGENCY DEPARTMENT Provider Note   CSN: [id number] Arrival date & time: 03/20/18  2219     History   Chief Complaint Chief Complaint  Patient presents with  . Chest Pain    HPI Jeremy Baker is a 22 y.o. male.  The history is provided by the patient.  He has history of hypertension, diabetes, asthma, morbid obesity and comes in complaining of chest discomfort for the last 2 days.  He describes both a tight feeling in his chest and a feeling like somebody is hitting him in his chest.  The symptoms wax and wane and are not related to exertion.  Nothing clearly makes them better, nothing makes them worse.  Discomfort will last for several minutes before resolving.  There is no associated dyspnea, nausea, diaphoresis.  He specifically denies any exertional component.  Discomfort does radiate into his left arm.  He has not done anything to treat it.  He puts his discomfort at 4/10.  He has been evaluated by a cardiologist and had a stress test in February which was unremarkable.  Of note, he states that his blood pressure and diabetes diagnoses were borderline and he is not on any medication for either.  He is a non-smoker and denies history of hyperlipidemia.  Past Medical History:  Diagnosis Date  . Asthma   . Depression   . Diabetes mellitus without complication (HCC)   . Hypertension   . Obesity     There are no active problems to display for this patient.   Past Surgical History:  Procedure Laterality Date  . APPENDECTOMY          Home Medications    Prior to Admission medications   Medication Sig Start Date End Date Taking? Authorizing Provider  lamoTRIgine (LAMICTAL) 25 MG tablet Take 25 mg by mouth daily. 06/14/16   [provider]  topiramate (TOPAMAX) 50 MG tablet Take 100 mg by mouth daily.  06/14/16   [provider]    Family History Family History  Problem Relation Age of Onset  . Diabetes Mother   .  Hypertension Mother   . Heart disease Mother   . Kidney failure Father   . Diabetes Father   . Heart disease Maternal Grandmother   . Cancer Maternal Grandfather   . Kidney failure Paternal Grandfather     Social History Social History   Tobacco Use  . Smoking status: Never Smoker  . Smokeless tobacco: Never Used  Substance Use Topics  . Alcohol use: No  . Drug use: No     Allergies   Aspirin and Sertraline   Review of Systems Review of Systems  All other systems reviewed and are negative.    Physical Exam Updated Vital Signs BP 123/79   Pulse 74   Temp 98.6 F (37 C)   Resp (!) 7   Ht 6\' 4"  (1.93 m)   Wt (!) 170.1 kg (375 lb)   SpO2 99%   BMI 45.65 kg/m   Physical Exam  Nursing note and vitals reviewed.  Morbidly obese 22 year old male, resting comfortably and in no acute distress. Vital signs are normal. Oxygen saturation is 99%, which is normal. Head is normocephalic and atraumatic. PERRLA, EOMI. Oropharynx is clear. Neck is nontender and supple without adenopathy or JVD. Back is nontender and there is no CVA tenderness. Lungs are clear without rales, wheezes, or rhonchi. Chest is mildly tender in the left anterior chest wall in the  midclavicular line, but this does not reproduce his pain. Heart has regular rate and rhythm without murmur. Abdomen is soft, flat, nontender without masses or hepatosplenomegaly and peristalsis is normoactive. Extremities have no cyanosis or edema, full range of motion is present. Skin is warm and dry without rash. Neurologic: Mental status is normal, cranial nerves are intact, there are no motor or sensory deficits.  ED Treatments / Results  Labs (all labs ordered are listed, but only abnormal results are displayed) Labs Reviewed  BASIC METABOLIC PANEL  CBC  I-STAT TROPONIN, ED    EKG EKG Interpretation  Date/Time:  Thursday March 20 2018 22:25:55 EDT Ventricular Rate:  84 PR Interval:  144 QRS Duration: 80 QT  Interval:  338 QTC Calculation: 399 R Axis:   44 Text Interpretation:  Normal sinus rhythm with sinus arrhythmia Nonspecific T wave abnormality Abnormal ECG When compared with ECG of 09/01/2017, No significant change was found Confirmed by Dione BoozeGlick, Larcenia Holaday ([id number]) on 03/20/2018 11:22:13 PM   Radiology Dg Chest 2 View  Result Date: 03/20/2018 CLINICAL DATA:  Chest pain EXAM: CHEST - 2 VIEW COMPARISON:  09/01/2017 FINDINGS: Normal heart size and mediastinal contours. No acute infiltrate or edema. No effusion or pneumothorax. No acute osseous findings. IMPRESSION: Negative chest. Electronically Signed   By: Marnee SpringJonathon  Watts M.D.   On: 03/20/2018 23:01    Procedures Procedures  Medications Ordered in ED Medications  ibuprofen (ADVIL,MOTRIN) tablet 800 mg (has no administration in time range)     Initial Impression / Assessment and Plan / ED Course  I have reviewed the triage vital signs and the nursing notes.  Pertinent labs & imaging results that were available during my care of the patient were reviewed by me and considered in my medical decision making (see chart for details).  Chest discomfort of uncertain cause.  ECG is unchanged and troponin is normal.  Symptoms are quite atypical of ACS.  Old records are reviewed confirming cardiology evaluation within the past 6 months and negative stress test although he only completed 4 minutes of Bruce protocol.  Heart score is 2 which puts him at low risk for major adverse cardiac events in the next 6 weeks.  I suspect a component of chest wall pain.  Prior ED visits and cardiology evaluation of also suggested component of chest wall pain.  He will be kept in the ED for repeat troponin and will be given a dose of ibuprofen.  Aspirin is not given because of side effect of nosebleeds.  With low risk of ACS, I feel that the risk is greater than benefit for aspirin administration.  Of note, blood pressure and glucose are normal today.  He is negative by [facility]  and Wells criteria, so no need for d-dimer to rule out pulmonary embolism.  Repeat troponin is normal.  He had no significant relief with ibuprofen.  However, I do not see any indication for hospital admission at this point.  He is discharged with instructions to follow-up with PCP and with his cardiologist, return precautions discussed.  Final Clinical Impressions(s) / ED Diagnoses   Final diagnoses:  Atypical chest pain    ED Discharge Orders    None       Dione BoozeGlick, Brinleigh Tew, MD 03/21/18 [phone]

## 2018-03-21 LAB — I-STAT TROPONIN, ED: TROPONIN I, POC: 0 ng/mL (ref 0.00–0.08)

## 2018-03-21 NOTE — Discharge Instructions (Addendum)
Your evaluation today showed no sign of any of the serious causes of chest pain.  However, if symptoms are getting worse, then return to the emergency department for further evaluation.

## 2018-04-30 ENCOUNTER — Encounter: Payer: Self-pay | Admitting: Physical Therapy

## 2018-04-30 ENCOUNTER — Ambulatory Visit: Payer: Managed Care, Other (non HMO) | Attending: Nurse Practitioner | Admitting: Physical Therapy

## 2018-04-30 ENCOUNTER — Other Ambulatory Visit: Payer: Self-pay

## 2018-04-30 DIAGNOSIS — M25612 Stiffness of left shoulder, not elsewhere classified: Secondary | ICD-10-CM | POA: Insufficient documentation

## 2018-04-30 DIAGNOSIS — M25512 Pain in left shoulder: Secondary | ICD-10-CM | POA: Insufficient documentation

## 2018-04-30 NOTE — Therapy (Signed)
[facility] [address] [place], [place], [address] Phone: [phone]   Fax:  [phone]  Physical Therapy Evaluation  Patient Details  Name: Jeremy Baker MRN: [id number] Date of Birth: [birth year] Referring Provider: Zoe LanPenny Jones   Encounter Date: 04/30/2018  PT End of Session - 04/30/18 1027    Visit Number  1    Date for PT Re-Evaluation  06/30/18    PT Start Time  1000    PT Stop Time  1030    PT Time Calculation (min)  30 min    Activity Tolerance  Patient tolerated treatment well    Behavior During Therapy  [facility] for tasks assessed/performed       Past Medical History:  Diagnosis Date  . Asthma   . Depression   . Diabetes mellitus without complication (HCC)   . Hypertension   . Obesity     Past Surgical History:  Procedure Laterality Date  . APPENDECTOMY      There were no vitals filed for this visit.   Subjective Assessment - 04/30/18 1004    Subjective  Patient reports that he has had pain in the left shoulder, chest and arm for aobut 3 months. Unsure of a recent cause, the MD ruled out heart issues.  He reports that he injured his chest while working 7-8 years ago.  X-rays of chest is negative    Limitations  Lifting;House hold activities    Patient Stated Goals  have less pain, easier motions    Currently in Pain?  Yes    Pain Score  0-No pain    Pain Location  Shoulder    Pain Orientation  Left    Pain Descriptors / Indicators  Aching;Sharp    Pain Type  Acute pain    Pain Radiating Towards  c/o some throbbing and warmth in the left elbow to the hand    Pain Onset  More than a month ago    Pain Frequency  Intermittent    Aggravating Factors   reaching, lifting or reports at times the pain just comes and goes  pain up to 10/10    Pain Relieving Factors  reports that when pain is going on he cannot find anything that helps, reports with rest at times the pain is 0/10    Effect of Pain on  Daily Activities  difficulty with work and with ADL's         [facility] Assessment - 04/30/18 0001      Assessment   Medical Diagnosis  left shoulder pain, some chest pain    Referring Provider  Zoe LanPenny Jones    Onset Date/Surgical Date  03/30/18    Hand Dominance  Right    Prior Therapy  n      Precautions   Precautions  None      Balance Screen   Has the patient fallen in the past 6 months  No    Has the patient had a decrease in activity level because of a fear of falling?   No    Is the patient reluctant to leave their home because of a fear of falling?   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#, some repetitive motions  Posture/Postural Control   Posture Comments  fwdm head, rounded shoulders slouched sitting      ROM / Strength   AROM / PROM / Strength  AROM;Strength      AROM   AROM Assessment Site  Shoulder    Right/Left Shoulder  Left    Left Shoulder Flexion  170 Degrees    Left Shoulder ABduction  150 Degrees   c/o tightness   Left Shoulder Internal Rotation  85 Degrees    Left Shoulder External Rotation  30 Degrees   pain     Strength   Overall Strength Comments  4/5 with some pain for ER/IR MMT      Palpation   Palpation comment  some spasms in the upper trap and rhomboid                Objective measurements completed on examination: See above findings.              PT Education - 04/30/18 1026    Education Details  HEP with green tband for scapular stabilization and ER    Person(s) Educated  Patient    Methods  Explanation;Demonstration;Tactile cues;Verbal cues;Handout    Comprehension  Verbal cues required;Tactile cues required;Verbalized understanding       PT Short Term Goals - 04/30/18 1152      PT SHORT TERM GOAL #1   Title  independent with  initial HEP    Time  3    Period  Weeks    Status  New        PT Long Term Goals - 04/30/18 1152      PT LONG TERM GOAL #1   Title  understand proper posture and body mechanics    Time  8    Period  Weeks    Status  New      PT LONG TERM GOAL #2   Title  decrease pain 50%    Time  8    Period  Weeks    Status  New      PT LONG TERM GOAL #3   Title  increase AROM of ER to 60 degrees    Time  8    Period  Weeks    Status  New      PT LONG TERM GOAL #4   Title  independent and safe with an advanced HEP or gym program    Time  8    Period  Weeks    Status  New             Plan - 04/30/18 1029    Clinical Impression Statement  Patient with c/o left shoulder and chest pain, heart issues ruled out by MD.  He has very poor posture.  ROM is good except had pain and limitation with ER.  Non tender to palpation, + impingment test, negative empty can test.  Due to the severely rounded shoulders he could have some rib issues that will need to be addressed    Clinical Presentation  Stable    Clinical Decision Making  Low    Rehab Potential  Good    PT Frequency  2x / week    PT Duration  8 weeks    PT Treatment/Interventions  ADLs/Self Care Home Management;Cryotherapy;Electrical Stimulation;Iontophoresis 4mg /ml Dexamethasone;Moist Heat;Ultrasound;Therapeutic activities;Therapeutic exercise;Manual techniques;Patient/family education    PT Next Visit Plan  focus on stabilization could look at rib mobility    Consulted and Agree with Plan of Care  Patient       Patient will benefit from skilled therapeutic intervention in order to improve the following deficits and impairments:  Impaired UE functional use, Pain, Increased muscle spasms, Improper body mechanics, Decreased range of motion  Visit Diagnosis: Acute pain of left shoulder - Plan: PT plan of care cert/re-cert  Stiffness of left shoulder, not elsewhere classified - Plan: PT plan of care cert/re-cert     Problem  List There are no active problems to display for this patient.   Jearld Lesch., PT 04/30/2018, 11:57 AM  [facility] [address] [place] [address] [place], [place], [address] Phone: [phone]   Fax:  [phone]  Name: Jeremy Baker MRN: [id number] Date of Birth: [birth year]

## 2018-05-05 ENCOUNTER — Encounter: Payer: Self-pay | Admitting: Physical Therapy

## 2018-05-05 ENCOUNTER — Ambulatory Visit: Payer: Managed Care, Other (non HMO) | Admitting: Physical Therapy

## 2018-05-05 DIAGNOSIS — M25512 Pain in left shoulder: Secondary | ICD-10-CM

## 2018-05-05 DIAGNOSIS — M25612 Stiffness of left shoulder, not elsewhere classified: Secondary | ICD-10-CM

## 2018-05-05 NOTE — Therapy (Addendum)
[place], [place], [address] Phone: [phone]   Fax:  [phone]  Physical Therapy Treatment  Patient Details  Name: Jeremy Baker MRN: [id number] Date of Birth: [birth year] Referring Provider: Eldridge Abrahams   Encounter Date: 05/05/2018  PT End of Session - 05/05/18 1223    Visit Number  2    Date for PT Re-Evaluation  06/30/18    PT Start Time  3532    PT Stop Time  1235    PT Time Calculation (min)  50 min    Activity Tolerance  Patient tolerated treatment well    Behavior During Therapy  [facility] for tasks assessed/performed       Past Medical History:  Diagnosis Date  . Asthma   . Depression   . Diabetes mellitus without complication ([place])   . Hypertension   . Obesity     Past Surgical History:  Procedure Laterality Date  . APPENDECTOMY      There were no vitals filed for this visit.  Subjective Assessment - 05/05/18 1146    Subjective  Pt reportes that the pain in his shoulder is not as often    Currently in Pain?  No/denies                       [facility] Adult PT Treatment/Exercise - 05/05/18 0001      Exercises   Exercises  Shoulder      Shoulder Exercises: Standing   External Rotation  Theraband;20 reps;Both    Theraband Level (Shoulder External Rotation)  Level 2 (Red)    Internal Rotation  Theraband;15 reps;Left   x2   Theraband Level (Shoulder Internal Rotation)  Level 2 (Red)    Flexion  Weights;20 reps;Both    Shoulder Flexion Weight (lbs)  3    ABduction  Weights;20 reps;Both    Shoulder ABduction Weight (lbs)  2    Extension  Theraband;20 reps;Both    Theraband Level (Shoulder Extension)  Level 3 (Green)    Row  [employer];Both    Theraband Level (Shoulder Row)  Level 3 (Green)    Other Standing Exercises  bicep curls 4lb 2x15     Other Standing Exercises  tricep ext 25lb 2x15       Shoulder Exercises: ROM/Strengthening   UBE (Upper  Arm Bike)  L1.5 3 min each way      Modalities   Modalities  Cryotherapy      Cryotherapy   Number Minutes Cryotherapy  10 Minutes    Cryotherapy Location  Shoulder    Type of Cryotherapy  Ice pack      Manual Therapy   Manual Therapy  Passive ROM    Manual therapy comments  Full PROM    Passive ROM  L shoulder all directions               PT Short Term Goals - 05/05/18 1226      PT SHORT TERM GOAL #1   Title  independent with initial HEP    Status  Achieved        PT Long Term Goals - 04/30/18 1152      PT LONG TERM GOAL #1   Title  understand proper posture and body mechanics    Time  8    Period  Weeks    Status  New      PT LONG TERM GOAL #2   Title  decrease pain 50%    Time  8    Period  Weeks    Status  New      PT LONG TERM GOAL #3   Title  increase AROM of ER to 60 degrees    Time  8    Period  Weeks    Status  New      PT LONG TERM GOAL #4   Title  independent and safe with an advanced HEP or gym program    Time  8    Period  Weeks    Status  New            Plan - 05/05/18 1224    Clinical Impression Statement  Pt tolerated an initial progression to TE fair. He was able to do all of today's exercises. Initially he reports a dull burning sensation in his L shoulder with 3lb flexion. During triceps extensions pt reports some chest pain in the center of his chest. Pt stated hat he feel pressure in the center but this was normal to him. PT reports that his heart as been checked out finding no issues. He reports that the MD suspect it could be scar tissues from a past injury. Modalities to help with L shoulder bdull burning sensation.    Rehab Potential  Good    PT Duration  8 weeks    PT Next Visit Plan  focus on stabilization could look at rib mobility       Patient will benefit from skilled therapeutic intervention in order to improve the following deficits and impairments:  Impaired UE functional use, Pain, Increased muscle spasms,  Improper body mechanics, Decreased range of motion  Visit Diagnosis: Stiffness of left shoulder, not elsewhere classified  Acute pain of left shoulder     Problem List There are no active problems to display for this patient.  PHYSICAL THERAPY DISCHARGE SUMMARY  Visits from Start of Care: 2 Plan: Patient agrees to discharge.  Patient goals were not met. Patient is being discharged due to being pleased with the current functional level.  ?????      Scot Jun, PTA 05/05/2018, 12:28 PM  [place] [address] [place], [place], [address] Phone: [phone]   Fax:  [phone]  Name: Jeremy Baker MRN: [id number] Date of Birth: 08/15/1996

## 2018-07-31 ENCOUNTER — Emergency Department (HOSPITAL_COMMUNITY): Payer: Managed Care, Other (non HMO)

## 2018-07-31 ENCOUNTER — Other Ambulatory Visit: Payer: Self-pay

## 2018-07-31 ENCOUNTER — Observation Stay (HOSPITAL_COMMUNITY)
Admission: EM | Admit: 2018-07-31 | Discharge: 2018-08-01 | Disposition: A | Discharge status: Home / Self Care | Payer: Managed Care, Other (non HMO) | Attending: Internal Medicine | Admitting: Internal Medicine

## 2018-07-31 ENCOUNTER — Observation Stay (HOSPITAL_COMMUNITY): Payer: Managed Care, Other (non HMO)

## 2018-07-31 ENCOUNTER — Encounter (HOSPITAL_COMMUNITY): Payer: Self-pay | Admitting: Emergency Medicine

## 2018-07-31 DIAGNOSIS — I10 Essential (primary) hypertension: Secondary | ICD-10-CM | POA: Diagnosis not present

## 2018-07-31 DIAGNOSIS — R29898 Other symptoms and signs involving the musculoskeletal system: Secondary | ICD-10-CM | POA: Diagnosis present

## 2018-07-31 DIAGNOSIS — E669 Obesity, unspecified: Secondary | ICD-10-CM | POA: Diagnosis present

## 2018-07-31 DIAGNOSIS — R531 Weakness: Principal | ICD-10-CM | POA: Insufficient documentation

## 2018-07-31 DIAGNOSIS — Z794 Long term (current) use of insulin: Secondary | ICD-10-CM | POA: Insufficient documentation

## 2018-07-31 DIAGNOSIS — Z79899 Other long term (current) drug therapy: Secondary | ICD-10-CM | POA: Diagnosis not present

## 2018-07-31 DIAGNOSIS — J45909 Unspecified asthma, uncomplicated: Secondary | ICD-10-CM | POA: Diagnosis not present

## 2018-07-31 DIAGNOSIS — E119 Type 2 diabetes mellitus without complications: Secondary | ICD-10-CM

## 2018-07-31 DIAGNOSIS — F329 Major depressive disorder, single episode, unspecified: Secondary | ICD-10-CM | POA: Insufficient documentation

## 2018-07-31 DIAGNOSIS — F32A Depression, unspecified: Secondary | ICD-10-CM | POA: Diagnosis present

## 2018-07-31 DIAGNOSIS — R200 Anesthesia of skin: Secondary | ICD-10-CM | POA: Diagnosis present

## 2018-07-31 LAB — DIFFERENTIAL
Abs Immature Granulocytes: 0.02 10*3/uL (ref 0.00–0.07)
BASOS ABS: 0.1 10*3/uL (ref 0.0–0.1)
BASOS PCT: 1 %
EOS PCT: 1 %
Eosinophils Absolute: 0.1 10*3/uL (ref 0.0–0.5)
IMMATURE GRANULOCYTES: 0 %
Lymphocytes Relative: 36 %
Lymphs Abs: 2.2 10*3/uL (ref 0.7–4.0)
MONO ABS: 0.4 10*3/uL (ref 0.1–1.0)
Monocytes Relative: 7 %
NEUTROS PCT: 55 %
Neutro Abs: 3.3 10*3/uL (ref 1.7–7.7)

## 2018-07-31 LAB — COMPREHENSIVE METABOLIC PANEL
ALBUMIN: 4.2 g/dL (ref 3.5–5.0)
ALT: 19 U/L (ref 0–44)
ANION GAP: 7 (ref 5–15)
AST: 20 U/L (ref 15–41)
Alkaline Phosphatase: 72 U/L (ref 38–126)
BUN: 12 mg/dL (ref 6–20)
CHLORIDE: 112 mmol/L — AB (ref 98–111)
CO2: 22 mmol/L (ref 22–32)
Calcium: 9.3 mg/dL (ref 8.9–10.3)
Creatinine, Ser: 1.17 mg/dL (ref 0.61–1.24)
GFR calc non Af Amer: >60 mL/min (ref >60)
GLUCOSE: 90 mg/dL (ref 70–99)
POTASSIUM: 4.2 mmol/L (ref 3.5–5.1)
SODIUM: 141 mmol/L (ref 135–145)
Total Bilirubin: 0.6 mg/dL (ref 0.3–1.2)
Total Protein: 7.5 g/dL (ref 6.5–8.1)

## 2018-07-31 LAB — CBC
HEMATOCRIT: 49.3 % (ref 39.0–52.0)
HEMOGLOBIN: 15 g/dL (ref 13.0–17.0)
MCH: 26.2 pg (ref 26.0–34.0)
MCHC: 30.4 g/dL (ref 30.0–36.0)
MCV: 86 fL (ref 80.0–100.0)
NRBC: 0 % (ref 0.0–0.2)
Platelets: 285 10*3/uL (ref 150–400)
RBC: 5.73 MIL/uL (ref 4.22–5.81)
RDW: 13.1 % (ref 11.5–15.5)
WBC: 6.1 10*3/uL (ref 4.0–10.5)

## 2018-07-31 LAB — GLUCOSE, CAPILLARY: Glucose-Capillary: 81 mg/dL (ref 70–99)

## 2018-07-31 LAB — I-STAT CHEM 8, ED
BUN: 13 mg/dL (ref 6–20)
Calcium, Ion: 1.2 mmol/L (ref 1.15–1.40)
Chloride: 112 mmol/L — ABNORMAL HIGH (ref 98–111)
Creatinine, Ser: 1.1 mg/dL (ref 0.61–1.24)
GLUCOSE: 85 mg/dL (ref 70–99)
HEMATOCRIT: 48 % (ref 39.0–52.0)
HEMOGLOBIN: 16.3 g/dL (ref 13.0–17.0)
POTASSIUM: 4.1 mmol/L (ref 3.5–5.1)
SODIUM: 144 mmol/L (ref 135–145)
TCO2: 24 mmol/L (ref 22–32)

## 2018-07-31 LAB — I-STAT TROPONIN, ED: Troponin i, poc: 0 ng/mL (ref 0.00–0.08)

## 2018-07-31 LAB — CBG MONITORING, ED: Glucose-Capillary: 86 mg/dL (ref 70–99)

## 2018-07-31 LAB — PROTIME-INR
INR: 1.11
PROTHROMBIN TIME: 14.2 s (ref 11.4–15.2)

## 2018-07-31 LAB — APTT: APTT: 31 s (ref 24–36)

## 2018-07-31 MED ORDER — INSULIN ASPART 100 UNIT/ML ~~LOC~~ SOLN: 0.0000 [IU] | Freq: Three times a day (TID) | SUBCUTANEOUS | Status: DC

## 2018-07-31 MED ORDER — SODIUM CHLORIDE 0.9 % IV SOLN: 250.0000 mL | INTRAVENOUS | Status: DC | PRN

## 2018-07-31 MED ORDER — SODIUM CHLORIDE 0.9% FLUSH: 3.0000 mL | INTRAVENOUS | Status: DC | PRN

## 2018-07-31 MED ORDER — GADOBUTROL 1 MMOL/ML IV SOLN
10.0000 mL | Freq: Once | INTRAVENOUS | Status: AC | PRN
  Administered 2018-07-31: 10 mL via INTRAVENOUS

## 2018-07-31 MED ORDER — LAMOTRIGINE 100 MG PO TABS
100.0000 mg | ORAL_TABLET | Freq: Two times a day (BID) | ORAL | Status: DC
  Administered 2018-07-31 – 2018-08-01 (×2): 100 mg via ORAL
  Filled 2018-07-31 (×2): qty 1

## 2018-07-31 MED ORDER — TOPIRAMATE 25 MG PO TABS
50.0000 mg | ORAL_TABLET | Freq: Every day | ORAL | Status: DC
  Administered 2018-07-31: 50 mg via ORAL
  Filled 2018-07-31: qty 2

## 2018-07-31 MED ORDER — SODIUM CHLORIDE 0.9% FLUSH
3.0000 mL | Freq: Two times a day (BID) | INTRAVENOUS | Status: DC
  Administered 2018-07-31 – 2018-08-01 (×2): 3 mL via INTRAVENOUS

## 2018-07-31 NOTE — Consult Note (Signed)
NEURO HOSPITALIST CONSULT NOTE   Requestig physician: Dr. Jeraldine Loots  Reason for Consult: Left upper extremity weakness and numbness in conjunction with left face and arm twitching with subjectively altered sensorium  History obtained from:  Patient and EMS  HPI:                                                                                                                                          Jeremy Baker is an 22 y.o. male who was brought to the ED via EMS from his job at [employer] for emergent evaluation of sudden onset left upper extremity weakness and numbness in conjunction with left face and arm twitching with subjectively altered sensorium. The patient denies prior similar episodes. His PMHx includes DM, HTN, depression, asthma and morbid obesity.   ED Triage nurse note reviewed, which provides additional information: "Pt arrives with sudden onset L sided numbness and chest pain starting at approx 1400. States L sided extremities are "cool" to the touch, and weak. No drift noted. Pt reports "feeling loopy" states he's having trouble processing what's going on around him."  After initial assessment at the bridge, stroke was felt to be unlikely but decision was made to fully rule out with STAT MRI brain.   Past Medical History:  Diagnosis Date  . Asthma   . Depression   . Diabetes mellitus without complication (HCC)   . Hypertension   . Obesity     Past Surgical History:  Procedure Laterality Date  . APPENDECTOMY      Family History  Problem Relation Age of Onset  . Diabetes Mother   . Hypertension Mother   . Heart disease Mother   . Kidney failure Father   . Diabetes Father   . Heart disease Maternal Grandmother   . Cancer Maternal Grandfather   . Kidney failure Paternal Grandfather               Social History:  reports that he has never smoked. He has never used smokeless tobacco. He reports that he does not drink alcohol or use  drugs.  Allergies  Allergen Reactions  . Aspirin Other (See Comments)    Nose bleeds  . Sertraline Nausea Only    Solution form causes nausea    MEDICATIONS:  Lamictal 25 mg po qd Topamax 100 mg po qd  ROS:                                                                                                                                       As per HPI. Does not endorse additional symptoms.    There were no vitals taken for this visit.   General Examination:                                                                                                       Physical Exam  General: Morbidly obese HEENT-  [place]/AT  Lungs- Respirations unlabored Extremities- Warm and well perfused. No edema.  Neurological Examination Mental Status: Alert and fully oriented. Initially had stuttering quality of speech in conjunction with facial twitching and a blank stare, which after several attempts at questioning resolved, with patient answering questions normally. Speech for the remainder of the exam was fluent with intact comprehension and naming, no dysarthria.  Cranial Nerves: II: Fixates and tracks normally.   III,IV, VI: EOMI without nystagmus. No ptosis.  V,VII: Grimace and smile are symmetric. Facial temp sensation equal bilaterally VIII: hearing intact to voice IX,X: No hoarseness or hypophonia XI: Symmetric XII: midline tongue extension Motor: Right : Upper extremity   5/5    Left:     Upper extremity   4/5  Lower extremity   5/5     Lower extremity   5/5 No pronator drift Sensory: Temp sensation decreased to proximal forearm and distal upper arm above elbow. Temp sensation intact to RUE and BLE. No extinction to DSS.   Deep Tendon Reflexes: 2+ and symmetric in upper and lower extremities Cerebellar: No ataxia with FNF bilaterally  Gait: Deferred Other: Twitching to face  appeared likely to have a voluntary component and subsided with distraction. There was low frequency twitching like movements of the patient's left thumb and at times left fingers, which waxed and waned and also transiently resolved with distraction.    Lab Results: Basic Metabolic Panel: Recent Labs  Lab 07/31/18 1448  NA 144  K 4.1  CL 112*  GLUCOSE 85  BUN 13  CREATININE 1.10    CBC: Recent Labs  Lab 07/31/18 1448  HGB 16.3  HCT 48.0    Cardiac Enzymes: No results for input(s): CKTOTAL, CKMB, CKMBINDEX, TROPONINI in the last 168 hours.  Lipid Panel: No results for input(s): CHOL, TRIG, HDL, CHOLHDL, VLDL, LDLCALC in the last 168 hours.  Imaging: No results found.  Assessment: 22 year old male presenting with acute onset of left upper extremity weakness and numbness in conjunction with left face and arm twitching with subjectively altered sensorium  1. Exam had some functional components and isolated LUE weakness with sensory loss in a portion of the lateral aspect in a non-dermatomal distribution were suspicious for a functional/non-neurological etiology. The distractable twitching was also a questionably significant finding.  2. MRI brain was therefore ordered, which was normal. MRA head was also normal.   Recommendations: 1. If LUE weakness does not resolve, would obtain an MRI of the cervical spine to rule out a cord lesion 2. If twitching continues, would obtain an EEG 3. Close monitoring.  4. He will need outpatient follow up with [facility] or [facility]  Electronically signed: Dr. Caryl Pina      Electronically signed: Dr. Caryl Pina  07/31/2018, 2:54 PM

## 2018-07-31 NOTE — ED Triage Notes (Signed)
Pt arrives with sudden onset L sided numbness and chest pain starting at approx 1400. States L sided extremities are "cool" to the touch, and weak. No drift noted. Pt reports "feeling loopy" states he's having trouble processing what's going on around him.

## 2018-07-31 NOTE — ED Notes (Signed)
Transported patient to MRI

## 2018-07-31 NOTE — ED Provider Notes (Signed)
[facility] EMERGENCY DEPARTMENT Provider Note   CSN: [id number] Arrival date & time: 07/31/18  1436   An emergency department physician performed an initial assessment on this suspected stroke patient at 1442(pfeiffer).  History   Chief Complaint Chief Complaint  Patient presents with  . Code Stroke    HPI Jeremy Baker is a 22 y.o. male.  HPI  Patient presents with concern of left arm numbness, heaviness. Onset was about 90 minutes prior to my evaluation, and since that time the heavy sensation is resolved, but he continues to feel numbness from the left shoulder to the left wrist. No clear precipitant, and no prior similar episodes. Patient states that he is generally well, denies history of chronic medical disease beyond depression. Patient was at work, but not performing any unusual activities when symptoms began. Since onset patient has had no other weakness, no speech difficulty, no syncope, has no pain beyond the aforementioned heavy sensation that resolved.  Past Medical History:  Diagnosis Date  . Asthma   . Depression   . Diabetes mellitus without complication (HCC)   . Hypertension   . Obesity     Patient Active Problem List   Diagnosis Date Noted  . Weakness of extremity 07/31/2018  . Depression 07/31/2018  . Hypertension   . Diabetes mellitus without complication (HCC)   . Obesity   . Asthma     Past Surgical History:  Procedure Laterality Date  . APPENDECTOMY          Home Medications    Prior to Admission medications   Medication Sig Start Date End Date Taking? Authorizing Provider  lamoTRIgine (LAMICTAL) 100 MG tablet Take 100 mg by mouth 2 (two) times daily.   Yes [provider]  topiramate (TOPAMAX) 50 MG tablet Take 50 mg by mouth at bedtime.  06/14/16  Yes [provider]    Family History Family History  Problem Relation Age of Onset  . Diabetes Mother   . Hypertension Mother   . Heart  disease Mother   . Kidney failure Father   . Diabetes Father   . Heart disease Maternal Grandmother   . Cancer Maternal Grandfather   . Kidney failure Paternal Grandfather     Social History Social History   Tobacco Use  . Smoking status: Never Smoker  . Smokeless tobacco: Never Used  Substance Use Topics  . Alcohol use: No  . Drug use: No     Allergies   Aspirin and Sertraline   Review of Systems Review of Systems  Constitutional:       Per HPI, otherwise negative  HENT:       Per HPI, otherwise negative  Respiratory:       Per HPI, otherwise negative  Cardiovascular:       Per HPI, otherwise negative  Gastrointestinal: Negative for vomiting.  Endocrine:       Negative aside from HPI  Genitourinary:       Neg aside from HPI   Musculoskeletal:       Per HPI, otherwise negative  Skin: Negative.   Neurological: Positive for weakness and numbness. Negative for syncope.     Physical Exam Updated Vital Signs BP (!) 142/84 (BP Location: Left Arm)   Pulse 77   Temp 98.3 F (36.8 C) (Oral)   Resp 16   Ht 6\' 4"  (1.93 m)   Wt (!) 172.4 kg   SpO2 99%   BMI 46.26 kg/m   Physical Exam  Constitutional: He is oriented to person, place, and time. He appears well-developed. No distress.  Obese young male awake and alert  HENT:  Head: Normocephalic and atraumatic.  Eyes: Conjunctivae and EOM are normal.  Cardiovascular: Normal rate and regular rhythm.  Pulmonary/Chest: Effort normal. No stridor. No respiratory distress.  Abdominal: He exhibits no distension.  Musculoskeletal: He exhibits no edema.  Neurological: He is alert and oriented to person, place, and time. He displays no atrophy and no tremor. He displays no seizure activity.  Cranial nerves unremarkable, speech clear, patient follows commands appropriately. There is 4/5 strength in the left proximal musculature upper extremity, no appreciable grip discrepancy, the patient notes subjective weakness in the  left grip compared to right.   Skin: Skin is warm and dry.  Psychiatric: He has a normal mood and affect.  Nursing note and vitals reviewed.    ED Treatments / Results  Labs (all labs ordered are listed, but only abnormal results are displayed) Labs Reviewed  COMPREHENSIVE METABOLIC PANEL - Abnormal; Notable for the following components:      Result Value   Chloride 112 (*)    All other components within normal limits  I-STAT CHEM 8, ED - Abnormal; Notable for the following components:   Chloride 112 (*)    All other components within normal limits  PROTIME-INR  APTT  CBC  DIFFERENTIAL  GLUCOSE, CAPILLARY  HIV ANTIBODY (ROUTINE TESTING W REFLEX)  BASIC METABOLIC PANEL  CBC  HEMOGLOBIN A1C  LIPID PANEL  I-STAT TROPONIN, ED  CBG MONITORING, ED    EKG EKG Interpretation  Date/Time:  Thursday July 31 2018 14:58:34 EST Ventricular Rate:  83 PR Interval:    QRS Duration: 92 QT Interval:  348 QTC Calculation: 409 R Axis:   3 Text Interpretation:  Sinus rhythm Probable left atrial enlargement RSR' in V1 or V2, probably normal variant T wave abnormality Abnormal ekg Confirmed by Gerhard MunchLockwood, Jasson Siegmann [phone]([id number]) on 07/31/2018 3:11:15 PM   Radiology Mr Maxine GlennMra Head Wo Contrast  Result Date: 07/31/2018 CLINICAL DATA:  Acute onset LEFT-sided weakness, chest pain this afternoon. Altered mental status. History of hypertension and diabetes. EXAM: MRI HEAD WITHOUT CONTRAST MRA HEAD WITHOUT CONTRAST TECHNIQUE: Multiplanar, multiecho pulse sequences of the brain and surrounding structures were obtained without intravenous contrast. Angiographic images of the head were obtained using MRA technique without contrast. COMPARISON:  None. FINDINGS: MRI HEAD FINDINGS INTRACRANIAL CONTENTS: No reduced diffusion to suggest acute ischemia or hyperacute demyelination. No susceptibility artifact to suggest hemorrhage. The ventricles and sulci are normal for patient's age. No suspicious parenchymal signal,  masses, mass effect. No abnormal extra-axial fluid collections. No extra-axial masses. VASCULAR: Normal major intracranial vascular flow voids present at skull base. SKULL AND UPPER CERVICAL SPINE: No abnormal sellar expansion. No suspicious calvarial bone marrow signal. Craniocervical junction maintained. SINUSES/ORBITS: The mastoid air-cells and included paranasal sinuses are well-aerated.The included ocular globes and orbital contents are non-suspicious. OTHER: None. MRA HEAD FINDINGS ANTERIOR CIRCULATION: Normal flow related enhancement of the included cervical, petrous, cavernous and supraclinoid internal carotid arteries. Patent anterior communicating artery. Patent anterior and middle cerebral arteries. No large vessel occlusion, flow limiting stenosis, aneurysm. POSTERIOR CIRCULATION: Codominant vertebral arteries. Vertebrobasilar arteries are patent, with normal flow related enhancement of the main branch vessels. Patent posterior cerebral arteries. No large vessel occlusion, flow limiting stenosis,  aneurysm. ANATOMIC VARIANTS: None. Source images and MIP images were reviewed. IMPRESSION: 1. Normal noncontrast MRI head. 2. Normal noncontrast MRA head. Electronically Signed   By: Pernell Dupreourtnay  Bloomer M.D.   On: 07/31/2018 16:28   Mr Brain Wo Contrast  Result Date: 07/31/2018 CLINICAL DATA:  Acute onset LEFT-sided weakness, chest pain this afternoon. Altered mental status. History of hypertension and diabetes. EXAM: MRI HEAD WITHOUT CONTRAST MRA HEAD WITHOUT CONTRAST TECHNIQUE: Multiplanar, multiecho pulse sequences of the brain and surrounding structures were obtained without intravenous contrast. Angiographic images of the head were obtained using MRA technique without contrast. COMPARISON:  None. FINDINGS: MRI HEAD FINDINGS INTRACRANIAL CONTENTS: No reduced diffusion to suggest acute ischemia or hyperacute demyelination. No susceptibility artifact to suggest hemorrhage. The ventricles and sulci are  normal for patient's age. No suspicious parenchymal signal, masses, mass effect. No abnormal extra-axial fluid collections. No extra-axial masses. VASCULAR: Normal major intracranial vascular flow voids present at skull base. SKULL AND UPPER CERVICAL SPINE: No abnormal sellar expansion. No suspicious calvarial bone marrow signal. Craniocervical junction maintained. SINUSES/ORBITS: The mastoid air-cells and included paranasal sinuses are well-aerated.The included ocular globes and orbital contents are non-suspicious. OTHER: None. MRA HEAD FINDINGS ANTERIOR CIRCULATION: Normal flow related enhancement of the included cervical, petrous, cavernous and supraclinoid internal carotid arteries. Patent anterior communicating artery. Patent anterior and middle cerebral arteries. No large vessel occlusion, flow limiting stenosis, aneurysm. POSTERIOR CIRCULATION: Codominant vertebral arteries. Vertebrobasilar arteries are patent, with normal flow related enhancement of the main branch vessels. Patent posterior cerebral arteries. No large vessel occlusion, flow limiting stenosis,  aneurysm. ANATOMIC VARIANTS: None. Source images and MIP images were reviewed. IMPRESSION: 1. Normal noncontrast MRI head. 2. Normal noncontrast MRA head. Electronically Signed   By: Awilda Metro M.D.   On: 07/31/2018 16:28   Procedures Procedures (including critical care time)  Medications Ordered in ED Medications  lamoTRIgine (LAMICTAL) tablet 100 mg (100 mg Oral Given 07/31/18 2204)  topiramate (TOPAMAX) tablet 50 mg (50 mg Oral Given 07/31/18 2205)  sodium chloride flush (NS) 0.9 % injection 3 mL (3 mLs Intravenous Given 07/31/18 2205)  sodium chloride flush (NS) 0.9 % injection 3 mL (has no administration in time range)  0.9 %  sodium chloride infusion (has no administration in time range)  insulin aspart (novoLOG) injection 0-9 Units (has no administration in time range)  gadobutrol (GADAVIST) 1 MMOL/ML injection 10 mL (10 mLs  Intravenous Contrast Given 07/31/18 2126)     Initial Impression / Assessment and Plan / ED Course  I have reviewed the triage vital signs and the nursing notes.  Pertinent labs & imaging results that were available during my care of the patient were reviewed by me and considered in my medical decision making (see chart for details).     On repeat exam the patient is in no distress, continues to complain of weakness in his left upper extremity, some dysesthesia as well. Patient has had episodes of chest discomfort, though none currently.  Update:, Patient in similar condition, final MRI results discussed with him, and per neurology, the patient will have MRI of the cervical spine, requires EEG given persistent weakness, possible twitching. I discussed the patient's case with our hospitalist colleagues for admission for further evaluation and management.  This young male presents with new left upper externally weakness, numbness. Patient is obese, has risk factors for stroke, and presented as a code stroke. MRI reassuring, but with ongoing numbness, weakness he was admitted for further evaluation and management.  Final Clinical Impressions(s) / ED Diagnoses  Weakness   Gerhard Munch, MD 07/31/18 2349

## 2018-07-31 NOTE — Code Documentation (Signed)
22[facility] [id number]. Patient reports sudden onset of left arm numbness and weakness at 1400. Patient also reporting chest pain and difficulty processing. Code stroke activated by EMS for left arm weakness and stuttering. Stroke team at the bedside on patient arrival. Labs drawn and patient cleared by Dr. Donnald GarrePfeiffer. Dr. Otelia LimesLindzen assessed the patient and discontinued the CT scan. Patient to go to MRI. MRI called and made aware reporting table will not be available for ~9130minutes, Dr. Otelia LimesLindzen aware. Patient to ED room. NIHSS 1, see documentation for details and code stroke times. Patient reporting decreased sensation in his left arm on exam. No focal deficits appreciated. Patient remains in the tPA window until 1830 should symptoms worsen. Bedside handoff with ED RN Emilie.

## 2018-07-31 NOTE — H&P (Signed)
History and Physical    Einer Meals ONG:[id number] DOB: 1995-12-13 DOA: 07/31/2018  PCP: Iona Hansen, NP  Patient coming from: Home  Chief Complaint: Left-sided numbness and left arm weakness  HPI: Shaquile Lutze is a 22 y.o. male with medical history significant of obesity, diabetes, hypertension, depression comes in with sudden onset of left arm and leg numbness and arm weakness that suddenly occurred prior to arrival.  Code stroke was called.  He says his symptoms are better now but he still having some left arm weakness.  He denies any recent illnesses no fevers no nausea vomiting or diarrhea.  He denies any neck pain or recent neck injury.  He denies any shooting sensations down his arm.  He has no history of seizures.  He is on Lamictal and Topamax for depression treatment.  Patient is being referred for admission for his symptoms he has had a normal MRI.  Neurology service has recommended further imaging of his C-spine along with an EEG to rule out seizure.  Review of Systems: As per HPI otherwise 10 point review of systems negative.   Past Medical History:  Diagnosis Date  . Asthma   . Depression   . Diabetes mellitus without complication (HCC)   . Hypertension   . Obesity     Past Surgical History:  Procedure Laterality Date  . APPENDECTOMY       reports that he has never smoked. He has never used smokeless tobacco. He reports that he does not drink alcohol or use drugs.  Allergies  Allergen Reactions  . Aspirin Other (See Comments)    Nose bleeds   . Sertraline Nausea Only and Other (See Comments)    "Solution" form causes nausea     Family History  Problem Relation Age of Onset  . Diabetes Mother   . Hypertension Mother   . Heart disease Mother   . Kidney failure Father   . Diabetes Father   . Heart disease Maternal Grandmother   . Cancer Maternal Grandfather   . Kidney failure Paternal Grandfather     Prior to Admission medications   Medication  Sig Start Date End Date Taking? Authorizing Provider  lamoTRIgine (LAMICTAL) 100 MG tablet Take 100 mg by mouth 2 (two) times daily.   Yes [provider]  topiramate (TOPAMAX) 50 MG tablet Take 50 mg by mouth at bedtime.  06/14/16  Yes [provider]    Physical Exam: Vitals:   07/31/18 1712 07/31/18 1730 07/31/18 1800 07/31/18 1857  BP: 138/71 118/75 130/76 128/79  Pulse: 73 62 69 77  Resp: 16 11 12 16   Temp:      TempSrc:      SpO2: 98% 97% 100% 99%  Weight:      Height:          Constitutional: NAD, calm, comfortable Vitals:   07/31/18 1712 07/31/18 1730 07/31/18 1800 07/31/18 1857  BP: 138/71 118/75 130/76 128/79  Pulse: 73 62 69 77  Resp: 16 11 12 16   Temp:      TempSrc:      SpO2: 98% 97% 100% 99%  Weight:      Height:       Eyes: PERRL, lids and conjunctivae normal ENMT: Mucous membranes are moist. Posterior pharynx clear of any exudate or lesions.Normal dentition.  Neck: normal, supple, no masses, no thyromegaly Respiratory: clear to auscultation bilaterally, no wheezing, no crackles. Normal respiratory effort. No accessory muscle use.  Cardiovascular: Regular rate and rhythm, no  murmurs / rubs / gallops. No extremity edema. 2+ pedal pulses. No carotid bruits.  Abdomen: no tenderness, no masses palpated. No hepatosplenomegaly. Bowel sounds positive.  Musculoskeletal: no clubbing / cyanosis. No joint deformity upper and lower extremities. Good ROM, no contractures. Normal muscle tone.  Skin: no rashes, lesions, ulcers. No induration Neurologic: CN 2-12 grossly intact. Sensation intact, DTR normal. Strength 5/5 in all 4.  Psychiatric: Normal judgment and insight. Alert and oriented x 3. Normal mood.    Labs on Admission: I have personally reviewed following labs and imaging studies  CBC: Recent Labs  Lab 07/31/18 1438 07/31/18 1448  WBC 6.1  --   NEUTROABS 3.3  --   HGB 15.0 16.3  HCT 49.3 48.0  MCV 86.0  --   PLT 285  --    Basic  Metabolic Panel: Recent Labs  Lab 07/31/18 1438 07/31/18 1448  NA 141 144  K 4.2 4.1  CL 112* 112*  CO2 22  --   GLUCOSE 90 85  BUN 12 13  CREATININE 1.17 1.10  CALCIUM 9.3  --    GFR: Estimated Creatinine Clearance: 177.2 mL/min (by C-G formula based on SCr of 1.1 mg/dL). Liver Function Tests: Recent Labs  Lab 07/31/18 1438  AST 20  ALT 19  ALKPHOS 72  BILITOT 0.6  PROT 7.5  ALBUMIN 4.2   No results for input(s): LIPASE, AMYLASE in the last 168 hours. No results for input(s): AMMONIA in the last 168 hours. Coagulation Profile: Recent Labs  Lab 07/31/18 1438  INR 1.11   Cardiac Enzymes: No results for input(s): CKTOTAL, CKMB, CKMBINDEX, TROPONINI in the last 168 hours. BNP (last 3 results) No results for input(s): PROBNP in the last 8760 hours. HbA1C: No results for input(s): HGBA1C in the last 72 hours. CBG: Recent Labs  Lab 07/31/18 1453  GLUCAP 86   Lipid Profile: No results for input(s): CHOL, HDL, LDLCALC, TRIG, CHOLHDL, LDLDIRECT in the last 72 hours. Thyroid Function Tests: No results for input(s): TSH, T4TOTAL, FREET4, T3FREE, THYROIDAB in the last 72 hours. Anemia Panel: No results for input(s): VITAMINB12, FOLATE, FERRITIN, TIBC, IRON, RETICCTPCT in the last 72 hours. Urine analysis: No results found for: COLORURINE, APPEARANCEUR, LABSPEC, PHURINE, GLUCOSEU, HGBUR, BILIRUBINUR, KETONESUR, PROTEINUR, UROBILINOGEN, NITRITE, LEUKOCYTESUR Sepsis Labs: !!!!!!!!!!!!!!!!!!!!!!!!!!!!!!!!!!!!!!!!!!!! @LABRCNTIP (procalcitonin:4,lacticidven:4) )No results found for this or any previous visit (from the past 240 hour(s)).   Radiological Exams on Admission: Mr Shirlee LatchMra Head AVWo Contrast  Result Date: 07/31/2018 CLINICAL DATA:  Acute onset LEFT-sided weakness, chest pain this afternoon. Altered mental status. History of hypertension and diabetes. EXAM: MRI HEAD WITHOUT CONTRAST MRA HEAD WITHOUT CONTRAST TECHNIQUE: Multiplanar, multiecho pulse sequences of the brain  and surrounding structures were obtained without intravenous contrast. Angiographic images of the head were obtained using MRA technique without contrast. COMPARISON:  None. FINDINGS: MRI HEAD FINDINGS INTRACRANIAL CONTENTS: No reduced diffusion to suggest acute ischemia or hyperacute demyelination. No susceptibility artifact to suggest hemorrhage. The ventricles and sulci are normal for patient's age. No suspicious parenchymal signal, masses, mass effect. No abnormal extra-axial fluid collections. No extra-axial masses. VASCULAR: Normal major intracranial vascular flow voids present at skull base. SKULL AND UPPER CERVICAL SPINE: No abnormal sellar expansion. No suspicious calvarial bone marrow signal. Craniocervical junction maintained. SINUSES/ORBITS: The mastoid air-cells and included paranasal sinuses are well-aerated.The included ocular globes and orbital contents are non-suspicious. OTHER: None. MRA HEAD FINDINGS ANTERIOR CIRCULATION: Normal flow related enhancement of the included cervical, petrous, cavernous and supraclinoid internal carotid arteries. Patent anterior communicating  artery. Patent anterior and middle cerebral arteries. No large vessel occlusion, flow limiting stenosis, aneurysm. POSTERIOR CIRCULATION: Codominant vertebral arteries. Vertebrobasilar arteries are patent, with normal flow related enhancement of the main branch vessels. Patent posterior cerebral arteries. No large vessel occlusion, flow limiting stenosis,  aneurysm. ANATOMIC VARIANTS: None. Source images and MIP images were reviewed. IMPRESSION: 1. Normal noncontrast MRI head. 2. Normal noncontrast MRA head. Electronically Signed   By: Awilda Metro M.D.   On: 07/31/2018 16:28   Mr Brain Wo Contrast  Result Date: 07/31/2018 CLINICAL DATA:  Acute onset LEFT-sided weakness, chest pain this afternoon. Altered mental status. History of hypertension and diabetes. EXAM: MRI HEAD WITHOUT CONTRAST MRA HEAD WITHOUT CONTRAST  TECHNIQUE: Multiplanar, multiecho pulse sequences of the brain and surrounding structures were obtained without intravenous contrast. Angiographic images of the head were obtained using MRA technique without contrast. COMPARISON:  None. FINDINGS: MRI HEAD FINDINGS INTRACRANIAL CONTENTS: No reduced diffusion to suggest acute ischemia or hyperacute demyelination. No susceptibility artifact to suggest hemorrhage. The ventricles and sulci are normal for patient's age. No suspicious parenchymal signal, masses, mass effect. No abnormal extra-axial fluid collections. No extra-axial masses. VASCULAR: Normal major intracranial vascular flow voids present at skull base. SKULL AND UPPER CERVICAL SPINE: No abnormal sellar expansion. No suspicious calvarial bone marrow signal. Craniocervical junction maintained. SINUSES/ORBITS: The mastoid air-cells and included paranasal sinuses are well-aerated.The included ocular globes and orbital contents are non-suspicious. OTHER: None. MRA HEAD FINDINGS ANTERIOR CIRCULATION: Normal flow related enhancement of the included cervical, petrous, cavernous and supraclinoid internal carotid arteries. Patent anterior communicating artery. Patent anterior and middle cerebral arteries. No large vessel occlusion, flow limiting stenosis, aneurysm. POSTERIOR CIRCULATION: Codominant vertebral arteries. Vertebrobasilar arteries are patent, with normal flow related enhancement of the main branch vessels. Patent posterior cerebral arteries. No large vessel occlusion, flow limiting stenosis,  aneurysm. ANATOMIC VARIANTS: None. Source images and MIP images were reviewed. IMPRESSION: 1. Normal noncontrast MRI head. 2. Normal noncontrast MRA head. Electronically Signed   By: Awilda Metro M.D.   On: 07/31/2018 16:28    EKG: Independently reviewed.  Normal sinus rhythm no acute issues Old chart reviewed Case discussed with Dr. Jeraldine Loots in the emergency department  Assessment/Plan 22 year old male  with sudden onset of left arm and leg numbness with left arm weakness Principal Problem:   Weakness of extremity-MRI of the brain does not show any acute stroke.  Proceed with MRI of cervical spine and EEG in the morning.  Patient not overtly have any seizure activity.  Will observe overnight on telemetry bed.  Neurology has been consulted and is seen the patient in the ED.  Further recommendation depending on results of his EEG and MRI C-spine.  Obtain frequent neurological checks.  Check hemoglobin A1c and fasting lipid panel in the morning.  Active Problems:   Hypertension-stable    Diabetes mellitus without complication (HCC)-placed on sliding scale insulin and check hemoglobin A1c    Obesity-noted    Asthma-stable and compensated at this time    Depression-continue his Lamictal and Topamax    DVT prophylaxis: SCDs Code Status: Full Family Communication: None Disposition Plan: Tomorrow if work-up negative Consults called: Neurology Admission status: Observation   Mallisa Alameda A MD [facility]  If 7PM-7AM, please contact night-coverage [email or web address] Password [facility]  07/31/2018, 7:23 PM

## 2018-07-31 NOTE — ED Notes (Signed)
Please have patient call Mom [phone]

## 2018-08-01 ENCOUNTER — Observation Stay (HOSPITAL_COMMUNITY): Payer: Managed Care, Other (non HMO)

## 2018-08-01 DIAGNOSIS — R29898 Other symptoms and signs involving the musculoskeletal system: Secondary | ICD-10-CM | POA: Diagnosis not present

## 2018-08-01 DIAGNOSIS — E669 Obesity, unspecified: Secondary | ICD-10-CM

## 2018-08-01 LAB — BASIC METABOLIC PANEL
Anion gap: 6 (ref 5–15)
BUN: 11 mg/dL (ref 6–20)
CO2: 23 mmol/L (ref 22–32)
Calcium: 9.1 mg/dL (ref 8.9–10.3)
Chloride: 112 mmol/L — ABNORMAL HIGH (ref 98–111)
Creatinine, Ser: 1.22 mg/dL (ref 0.61–1.24)
GLUCOSE: 86 mg/dL (ref 70–99)
POTASSIUM: 3.6 mmol/L (ref 3.5–5.1)
Sodium: 141 mmol/L (ref 135–145)

## 2018-08-01 LAB — GLUCOSE, CAPILLARY
GLUCOSE-CAPILLARY: 96 mg/dL (ref 70–99)
GLUCOSE-CAPILLARY: 87 mg/dL (ref 70–99)
Glucose-Capillary: 92 mg/dL (ref 70–99)

## 2018-08-01 LAB — LIPID PANEL
Cholesterol: 163 mg/dL (ref 0–200)
HDL: 23 mg/dL — AB (ref >40)
LDL CALC: 125 mg/dL — AB (ref 0–99)
Total CHOL/HDL Ratio: 7.1 RATIO
Triglycerides: 73 mg/dL (ref <150)
VLDL: 15 mg/dL (ref 0–40)

## 2018-08-01 LAB — CBC
HCT: 45.7 % (ref 39.0–52.0)
HEMOGLOBIN: 14.1 g/dL (ref 13.0–17.0)
MCH: 26.2 pg (ref 26.0–34.0)
MCHC: 30.9 g/dL (ref 30.0–36.0)
MCV: 84.9 fL (ref 80.0–100.0)
NRBC: 0 % (ref 0.0–0.2)
PLATELETS: 291 10*3/uL (ref 150–400)
RBC: 5.38 MIL/uL (ref 4.22–5.81)
RDW: 13.2 % (ref 11.5–15.5)
WBC: 8.3 10*3/uL (ref 4.0–10.5)

## 2018-08-01 LAB — HEMOGLOBIN A1C
HEMOGLOBIN A1C: 5.4 % (ref 4.8–5.6)
MEAN PLASMA GLUCOSE: 108.28 mg/dL

## 2018-08-01 LAB — HIV ANTIBODY (ROUTINE TESTING W REFLEX): HIV SCREEN 4TH GENERATION: NONREACTIVE

## 2018-08-01 NOTE — Evaluation (Signed)
Physical Therapy Evaluation and Discharge Patient Details Name: Jeremy Baker MRN: [id number] DOB: 1996/05/01 Today's Date: 08/01/2018   History of Present Illness  Pt is a 22 y.o. male admitted after onset of L arm weakness, numbness and chest pain. MRI not significant. PMH includes obesity, HTN, DM, depression.  Clinical Impression  Patient evaluated by Physical Therapy with no further acute PT needs identified. Pt demonstrated inconsistencies in UE sensation for C5 and T1 dermatome, when repeated no asymmetries. Baseline strength except L hand intrinsics and L shoulder flexion 4/5. Pt amb 400 ft with no reported dizziness or chest pain. Pt scored 19/21 on DGI (no stairs performed, score adjusted) indicating not predictive of falls. A&Ox4. All education has been completed and the patient has no further questions. No follow-up Physical Therapy or equipment needs. PT is signing off. Thank you for this referral.     Follow Up Recommendations No PT follow up    Equipment Recommendations  None recommended by PT    Recommendations for Other Services       Precautions / Restrictions Precautions Precautions: Fall Restrictions Weight Bearing Restrictions: No      Mobility  Bed Mobility Overal bed mobility: Independent                Transfers Overall transfer level: Independent                  Ambulation/Gait Ambulation/Gait assistance: Independent Gait Distance (Feet): 400 Feet Assistive device: None Gait Pattern/deviations: Decreased dorsiflexion - right;Decreased dorsiflexion - left Gait velocity: at baseline per pt Gait velocity interpretation: >2.62 ft/sec, indicative of community ambulatory General Gait Details: Externally rotated legs which patient commented on without prompting is normal. Guarded. Min guard for assessment purposes. Overall patient is functioning at baseline and independent level for amb.  Stairs            Wheelchair Mobility     Modified Rankin (Stroke Patients Only) Modified Rankin (Stroke Patients Only) Pre-Morbid Rankin Score: No symptoms Modified Rankin: No significant disability     Balance Overall balance assessment: Needs assistance Sitting-balance support: No upper extremity supported;Feet supported Sitting balance-Leahy Scale: Normal Sitting balance - Comments: Donned pants, sat upright and took medicine from nurse   Standing balance support: No upper extremity supported;During functional activity Standing balance-Leahy Scale: Good                   Standardized Balance Assessment Standardized Balance Assessment : Dynamic Gait Index   Dynamic Gait Index Level Surface: Normal Change in Gait Speed: Normal Gait with Horizontal Head Turns: Normal Gait with Vertical Head Turns: Normal Gait and Pivot Turn: Mild Impairment Step Over Obstacle: Mild Impairment Step Around Obstacles: Normal       Pertinent Vitals/Pain Pain Assessment: No/denies pain    Home Living Family/patient expects to be discharged to:: Private residence Living Arrangements: Parent Available Help at Discharge: Family Type of Home: House Home Access: Level entry     Home Layout: One level Home Equipment: None      Prior Function Level of Independence: Independent         Comments: Works at [employer], 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 with no issue     Higher education careers adviserHand Dominance        Extremity/Trunk Assessment   Upper Extremity Assessment Upper Extremity Assessment: RUE deficits/detail;LUE deficits/detail RUE Deficits / Details: Shoulder flexion 5/5 Elbow flexion 5/5 Elbow extension 5/5 Wrist flexion 5/5 Hand intrinsics 5/5 RUE Coordination: (opposition normal, repeated fast opposition  normal) LUE Deficits / Details: Shoulder flexion 4/5 Elbow flexion 5/5 Elbow extension 4/5 Wrist flexion 5/5 Hand intrinsics 4/5 LUE Sensation: decreased light touch(inconsistencies in pt report, C5 and T1 felt "lighter" on left  initially but when repeated felt the same) LUE Coordination: WNL(opposition normal, repeated fast opposition normal)    Lower Extremity Assessment Lower Extremity Assessment: Overall WFL for tasks assessed       Communication   Communication: No difficulties  Cognition Arousal/Alertness: Awake/alert Behavior During Therapy: WFL for tasks assessed/performed Overall Cognitive Status: Within Functional Limits for tasks assessed                                 General Comments: A&Ox4      General Comments General comments (skin integrity, edema, etc.): 19/21 DGI (stairs not performed, adjusted score) indicating not predictive of falls. Parents arrived at end of session    Exercises     Assessment/Plan    PT Assessment Patent does not need any further PT services  PT Problem List         PT Treatment Interventions      PT Goals (Current goals can be found in the Care Plan section)  Acute Rehab PT Goals Patient Stated Goal: normal left UE strength PT Goal Formulation: With patient Time For Goal Achievement: 08/15/18 Potential to Achieve Goals: Good    Frequency     Barriers to discharge        Co-evaluation               AM-PAC PT "6 Clicks" Daily Activity  Outcome Measure Difficulty turning over in bed (including adjusting bedclothes, sheets and blankets)?: None Difficulty moving from lying on back to sitting on the side of the bed? : None Difficulty sitting down on and standing up from a chair with arms (e.g., wheelchair, bedside commode, etc,.)?: None Help needed moving to and from a bed to chair (including a wheelchair)?: None Help needed walking in hospital room?: None Help needed climbing 3-5 steps with a railing? : A Little 6 Click Score: 23    End of Session Equipment Utilized During Treatment: Gait belt Activity Tolerance: Patient tolerated treatment well Patient left: in bed;with family/visitor present Nurse Communication:  Mobility status PT Visit Diagnosis: Other symptoms and signs involving the nervous system (R29.898)    Time: 1610-9604 PT Time Calculation (min) (ACUTE ONLY): 18 min   Charges:   PT Evaluation $PT Eval Low Complexity: 1 Low          Pattricia Boss, SPT Acute Rehab Services [phone]   Pattricia Boss 08/01/2018, 11:36 AM

## 2018-08-01 NOTE — Procedures (Signed)
ELECTROENCEPHALOGRAM REPORT   Patient: Jeremy Baker       Room #: 5C16C EEG No. ID: [id number] Age: 22 y.o.        Sex: male Referring Physician: Benjamine MolaVann Report Date:  08/01/2018        Interpreting Physician: Thana FarrEYNOLDS, Stepfon Rawles  History: Jeremy Baker is an 22 y.o. male with acute onset left sided weakness  Medications:  Insulin, Lamictal, Topamax  Conditions of Recording:  This is a 21 channel routine scalp EEG performed with bipolar and monopolar montages arranged in accordance to the international 10/20 system of electrode placement. One channel was dedicated to EKG recording.  The patient is in the awake, drowsy and asleep states.  Description:  The waking background activity consists of a low voltage, symmetrical, fairly well organized, 10-11 Hz alpha activity, seen from the parieto-occipital and posterior temporal regions.  Low voltage fast activity, poorly organized, is seen anteriorly and is at times superimposed on more posterior regions.  A mixture of theta and alpha rhythms are seen from the central and temporal regions. The patient drowses with slowing to irregular, low voltage theta and beta activity.   The patient goes in to a light sleep with symmetrical sleep spindles, vertex central sharp transients and irregular slow activity.  No epileptiform activity is noted.   Hyperventilation and intermittent photic stimulation were not performed.   IMPRESSION: Normal electroencephalogram, awake and asleep. There are no focal lateralizing or epileptiform features.   Thana FarrLeslie Ranelle Auker, MD Neurology [phone] 08/01/2018, 1:14 PM

## 2018-08-01 NOTE — Progress Notes (Signed)
EEG completed; results pending.    

## 2018-08-01 NOTE — Discharge Summary (Signed)
Physician Discharge Summary  Jeremy Baker ZOX:[id number] DOB: Oct 01, 1995 DOA: 07/31/2018  PCP: Iona Hansen, NP  Admit date: 07/31/2018 Discharge date: 08/01/2018  Time spent: 45 minutes  Recommendations for Outpatient Follow-up:  1. Follow up with PCP 1-2 weeks for evaluation of symptoms 2. Recommend OP neuro referral   Discharge Diagnoses:  Principal Problem:   Weakness of extremity Active Problems:   Hypertension   Diabetes mellitus without complication (HCC)   Obesity   Asthma   Depression   Discharge Condition: stable  Diet recommendation: heart healthy  Filed Weights   07/31/18 1458  Weight: (!) 172.4 kg    History of present illness:  Jeremy Baker is a 22 y.o. male with medical history significant of obesity, diabetes, hypertension, depression presented 11/21 with sudden onset of left arm and leg numbness and arm weakness that suddenly occurred prior to arrival.  Code stroke was called.   His symptoms improved quickly.  He denied any recent illnesses no fevers no nausea vomiting or diarrhea.  He denied any neck pain or recent neck injury.  He denied any shooting sensations down his arm.  He has no history of seizures.  He is on Lamictal and Topamax for depression treatment.  Patient admitted for his symptoms he has had a normal MRI.  Neurology service has recommended further imaging of his C-spine along with an EEG to rule out seizure.   Hospital Course:  Weakness of extremity- resolved on day of discharge. MRI of the brain did not show any acute stroke. MRI of cspine negative and EEG within limits of normal. Evaluated by neuro who opined exam had some functional components and isolated LUE weakness with sensory loss in a portion of the lateral aspect in a non-dermatomal distribution were suspicious for a functional/non-neurological etiology. The distractable twitching was also a questionably significant finding.  Active Problems:   Hypertension-stable     Diabetes mellitus without complication (HCC)- Aic 5.4.     Obesity-bmi 46    Asthma- remained stable and compensated     Depression- home meds include Lamictal and Topamax. stable  Procedures:  EEG  Consultations:  [facility]  Discharge Exam: Vitals:   08/01/18 0049 08/01/18 0614  BP: 113/70 120/72  Pulse: 71 68  Resp: 16 16  Temp: 98.1 F (36.7 C) 98 F (36.7 C)  SpO2: 97% 99%    General: obese awake alert eating breakfast in no acute distess Cardiovascular: rrr no MGR no LE edema Respiratory: normal effort BS clear bilaterally no wheeze Neuro: awake alert speech clear facial symmetry. Left grip 4/5 right grip 5/5  Discharge Instructions   Discharge Instructions    Call MD for:  persistant dizziness or light-headedness   Complete by:  As directed    Call MD for:  temperature >100.4   Complete by:  As directed    Diet - low sodium heart healthy   Complete by:  As directed    Discharge instructions   Complete by:  As directed    Follow up with PCP 1-2 weeks regarding symptoms If symptoms reoccur return to Emergency Department   Increase activity slowly   Complete by:  As directed      Allergies as of 08/01/2018      Reactions   Aspirin Other (See Comments)   Nose bleeds   Sertraline Nausea Only, Other (See Comments)   "Solution" form causes nausea      Medication List    TAKE these medications   LAMICTAL 100  MG tablet Generic drug:  lamoTRIgine Take 100 mg by mouth 2 (two) times daily.   topiramate 50 MG tablet Commonly known as:  TOPAMAX Take 50 mg by mouth at bedtime.      Allergies  Allergen Reactions  . Aspirin Other (See Comments)    Nose bleeds   . Sertraline Nausea Only and Other (See Comments)    "Solution" form causes nausea       The results of significant diagnostics from this hospitalization (including imaging, microbiology, ancillary and laboratory) are listed below for reference.    Significant Diagnostic  Studies: Mr Shirlee Latch ZO Contrast  Result Date: 07/31/2018 CLINICAL DATA:  Acute onset LEFT-sided weakness, chest pain this afternoon. Altered mental status. History of hypertension and diabetes. EXAM: MRI HEAD WITHOUT CONTRAST MRA HEAD WITHOUT CONTRAST TECHNIQUE: Multiplanar, multiecho pulse sequences of the brain and surrounding structures were obtained without intravenous contrast. Angiographic images of the head were obtained using MRA technique without contrast. COMPARISON:  None. FINDINGS: MRI HEAD FINDINGS INTRACRANIAL CONTENTS: No reduced diffusion to suggest acute ischemia or hyperacute demyelination. No susceptibility artifact to suggest hemorrhage. The ventricles and sulci are normal for patient's age. No suspicious parenchymal signal, masses, mass effect. No abnormal extra-axial fluid collections. No extra-axial masses. VASCULAR: Normal major intracranial vascular flow voids present at skull base. SKULL AND UPPER CERVICAL SPINE: No abnormal sellar expansion. No suspicious calvarial bone marrow signal. Craniocervical junction maintained. SINUSES/ORBITS: The mastoid air-cells and included paranasal sinuses are well-aerated.The included ocular globes and orbital contents are non-suspicious. OTHER: None. MRA HEAD FINDINGS ANTERIOR CIRCULATION: Normal flow related enhancement of the included cervical, petrous, cavernous and supraclinoid internal carotid arteries. Patent anterior communicating artery. Patent anterior and middle cerebral arteries. No large vessel occlusion, flow limiting stenosis, aneurysm. POSTERIOR CIRCULATION: Codominant vertebral arteries. Vertebrobasilar arteries are patent, with normal flow related enhancement of the main branch vessels. Patent posterior cerebral arteries. No large vessel occlusion, flow limiting stenosis,  aneurysm. ANATOMIC VARIANTS: None. Source images and MIP images were reviewed. IMPRESSION: 1. Normal noncontrast MRI head. 2. Normal noncontrast MRA head.  Electronically Signed   By: Awilda Metro M.D.   On: 07/31/2018 16:28   Mr Brain Wo Contrast  Result Date: 07/31/2018 CLINICAL DATA:  Acute onset LEFT-sided weakness, chest pain this afternoon. Altered mental status. History of hypertension and diabetes. EXAM: MRI HEAD WITHOUT CONTRAST MRA HEAD WITHOUT CONTRAST TECHNIQUE: Multiplanar, multiecho pulse sequences of the brain and surrounding structures were obtained without intravenous contrast. Angiographic images of the head were obtained using MRA technique without contrast. COMPARISON:  None. FINDINGS: MRI HEAD FINDINGS INTRACRANIAL CONTENTS: No reduced diffusion to suggest acute ischemia or hyperacute demyelination. No susceptibility artifact to suggest hemorrhage. The ventricles and sulci are normal for patient's age. No suspicious parenchymal signal, masses, mass effect. No abnormal extra-axial fluid collections. No extra-axial masses. VASCULAR: Normal major intracranial vascular flow voids present at skull base. SKULL AND UPPER CERVICAL SPINE: No abnormal sellar expansion. No suspicious calvarial bone marrow signal. Craniocervical junction maintained. SINUSES/ORBITS: The mastoid air-cells and included paranasal sinuses are well-aerated.The included ocular globes and orbital contents are non-suspicious. OTHER: None. MRA HEAD FINDINGS ANTERIOR CIRCULATION: Normal flow related enhancement of the included cervical, petrous, cavernous and supraclinoid internal carotid arteries. Patent anterior communicating artery. Patent anterior and middle cerebral arteries. No large vessel occlusion, flow limiting stenosis, aneurysm. POSTERIOR CIRCULATION: Codominant vertebral arteries. Vertebrobasilar arteries are patent, with normal flow related enhancement of the main branch vessels. Patent posterior cerebral arteries. No large vessel occlusion,  flow limiting stenosis,  aneurysm. ANATOMIC VARIANTS: None. Source images and MIP images were reviewed. IMPRESSION: 1.  Normal noncontrast MRI head. 2. Normal noncontrast MRA head. Electronically Signed   By: Awilda Metroourtnay  Bloomer M.D.   On: 07/31/2018 16:28   Mr Cervical Spine W Or Wo Contrast  Result Date: 07/31/2018 CLINICAL DATA:  Acute onset of left-sided weakness the left upper extremity. EXAM: MRI CERVICAL SPINE WITHOUT AND WITH CONTRAST TECHNIQUE: Multiplanar and multiecho pulse sequences of the cervical spine, to include the craniocervical junction and cervicothoracic junction, were obtained without and with intravenous contrast. CONTRAST:  10 mL Gadavist COMPARISON:  MRI of the brain without contrast 11/31/2019. FINDINGS: Alignment: AP alignment is anatomic. There is straightening of the normal cervical lordosis. Vertebrae: Marrow signal and vertebral body heights are normal. Cord: Normal signal is present in the cervical and upper thoracic spinal cord. No abnormal enhancement is present. Posterior Fossa, vertebral arteries, paraspinal tissues: Craniocervical junction is normal. Flow is present in the vertebral arteries bilaterally. Paraspinous soft tissues are within normal limits. Disc levels: C2-3: Negative. C3-4: Mild uncovertebral spurring is present bilaterally without significant stenosis. C4-5: Negative. C5-6: Negative. C6-7: Negative. C7-T1: Negative. IMPRESSION: Negative MRI of the cervical spine. Electronically Signed   By: Marin Robertshristopher  Mattern M.D.   On: 07/31/2018 21:58    Microbiology: No results found for this or any previous visit (from the past 240 hour(s)).   Labs: Basic Metabolic Panel: Recent Labs  Lab 07/31/18 1438 07/31/18 1448 08/01/18 0550  NA 141 144 141  K 4.2 4.1 3.6  CL 112* 112* 112*  CO2 22  --  23  GLUCOSE 90 85 86  BUN 12 13 11   CREATININE 1.17 1.10 1.22  CALCIUM 9.3  --  9.1   Liver Function Tests: Recent Labs  Lab 07/31/18 1438  AST 20  ALT 19  ALKPHOS 72  BILITOT 0.6  PROT 7.5  ALBUMIN 4.2   No results for input(s): LIPASE, AMYLASE in the last 168  hours. No results for input(s): AMMONIA in the last 168 hours. CBC: Recent Labs  Lab 07/31/18 1438 07/31/18 1448 08/01/18 0550  WBC 6.1  --  8.3  NEUTROABS 3.3  --   --   HGB 15.0 16.3 14.1  HCT 49.3 48.0 45.7  MCV 86.0  --  84.9  PLT 285  --  291   Cardiac Enzymes: No results for input(s): CKTOTAL, CKMB, CKMBINDEX, TROPONINI in the last 168 hours. BNP: BNP (last 3 results) No results for input(s): BNP in the last 8760 hours.  ProBNP (last 3 results) No results for input(s): PROBNP in the last 8760 hours.  CBG: Recent Labs  Lab 07/31/18 1453 07/31/18 2207 08/01/18 0651 08/01/18 0807 08/01/18 1208  GLUCAP 86 81 92 96 87       Signed:  Toya SmothersBLACK, M NP  [facility] 08/01/2018, 2:05 PM

## 2019-02-25 ENCOUNTER — Encounter (HOSPITAL_COMMUNITY): Payer: Self-pay | Admitting: Emergency Medicine

## 2019-02-25 ENCOUNTER — Other Ambulatory Visit: Payer: Self-pay

## 2019-02-25 ENCOUNTER — Emergency Department (HOSPITAL_COMMUNITY): Payer: Managed Care, Other (non HMO)

## 2019-02-25 ENCOUNTER — Emergency Department (HOSPITAL_COMMUNITY)
Admission: EM | Admit: 2019-02-25 | Discharge: 2019-02-25 | Disposition: A | Discharge status: Home / Self Care | Payer: Managed Care, Other (non HMO) | Attending: Emergency Medicine | Admitting: Emergency Medicine

## 2019-02-25 DIAGNOSIS — E119 Type 2 diabetes mellitus without complications: Secondary | ICD-10-CM | POA: Insufficient documentation

## 2019-02-25 DIAGNOSIS — Z79899 Other long term (current) drug therapy: Secondary | ICD-10-CM | POA: Diagnosis not present

## 2019-02-25 DIAGNOSIS — Z20822 Contact with and (suspected) exposure to covid-19: Secondary | ICD-10-CM

## 2019-02-25 DIAGNOSIS — R0602 Shortness of breath: Secondary | ICD-10-CM | POA: Diagnosis present

## 2019-02-25 DIAGNOSIS — J45901 Unspecified asthma with (acute) exacerbation: Secondary | ICD-10-CM | POA: Diagnosis not present

## 2019-02-25 DIAGNOSIS — I10 Essential (primary) hypertension: Secondary | ICD-10-CM | POA: Diagnosis not present

## 2019-02-25 DIAGNOSIS — Z20828 Contact with and (suspected) exposure to other viral communicable diseases: Secondary | ICD-10-CM | POA: Diagnosis not present

## 2019-02-25 LAB — BASIC METABOLIC PANEL
Anion gap: 10 (ref 5–15)
BUN: 14 mg/dL (ref 6–20)
CO2: 20 mmol/L — ABNORMAL LOW (ref 22–32)
Calcium: 9.5 mg/dL (ref 8.9–10.3)
Chloride: 112 mmol/L — ABNORMAL HIGH (ref 98–111)
Creatinine, Ser: 1.31 mg/dL — ABNORMAL HIGH (ref 0.61–1.24)
GFR calc Af Amer: >60 mL/min (ref >60)
GFR calc non Af Amer: >60 mL/min (ref >60)
Glucose, Bld: 98 mg/dL (ref 70–99)
Potassium: 3.9 mmol/L (ref 3.5–5.1)
Sodium: 142 mmol/L (ref 135–145)

## 2019-02-25 LAB — TROPONIN I: Troponin I: <0.03 ng/mL (ref <0.03)

## 2019-02-25 LAB — CBC
HCT: 47.4 % (ref 39.0–52.0)
Hemoglobin: 15 g/dL (ref 13.0–17.0)
MCH: 26.9 pg (ref 26.0–34.0)
MCHC: 31.6 g/dL (ref 30.0–36.0)
MCV: 84.9 fL (ref 80.0–100.0)
Platelets: 291 10*3/uL (ref 150–400)
RBC: 5.58 MIL/uL (ref 4.22–5.81)
RDW: 13.3 % (ref 11.5–15.5)
WBC: 6.9 10*3/uL (ref 4.0–10.5)
nRBC: 0 % (ref 0.0–0.2)

## 2019-02-25 LAB — GROUP A STREP BY PCR: Group A Strep by PCR: NOT DETECTED

## 2019-02-25 LAB — D-DIMER, QUANTITATIVE: D-Dimer, Quant: <0.27 ug/mL-FEU (ref 0.00–0.50)

## 2019-02-25 MED ORDER — ACETAMINOPHEN 325 MG PO TABS
650.0000 mg | ORAL_TABLET | Freq: Once | ORAL | Status: AC
  Administered 2019-02-25: 650 mg via ORAL
  Filled 2019-02-25: qty 2

## 2019-02-25 MED ORDER — PREDNISONE 20 MG PO TABS: 60.0000 mg | ORAL_TABLET | Freq: Every day | ORAL | 0 refills | Status: AC

## 2019-02-25 MED ORDER — AEROCHAMBER PLUS FLO-VU MISC
1.0000 | Freq: Once | Status: AC
  Administered 2019-02-25: 1
  Filled 2019-02-25: qty 1

## 2019-02-25 MED ORDER — ALBUTEROL SULFATE HFA 108 (90 BASE) MCG/ACT IN AERS
4.0000 | INHALATION_SPRAY | Freq: Once | RESPIRATORY_TRACT | Status: AC
  Administered 2019-02-25: 4 via RESPIRATORY_TRACT
  Filled 2019-02-25: qty 6.7

## 2019-02-25 MED ORDER — SODIUM CHLORIDE 0.9% FLUSH: 3.0000 mL | Freq: Once | INTRAVENOUS | Status: DC

## 2019-02-25 NOTE — Discharge Instructions (Signed)
I suspect your symptoms were likely a mild asthma exacerbation, your labs have overall been reassuring today.  I have sent a coronavirus test and you will be called with results in about 2 days, please continue to self quarantine at home until these results are back, if negative you may return to work.  Please take steroids for the next 5 days and use albuterol inhaler as needed.  Return to the emergency department for any worsening shortness of breath, chest pain or other new or concerning symptoms.

## 2019-02-25 NOTE — ED Triage Notes (Signed)
Patient reports sudden onset shortness of breath and L-sided chest discomfort while at work today. Endorses history of asthma, but states this does not feel like his normal asthma attack. Patient sweaty but he states driving makes him nervous and he drove here.

## 2019-02-25 NOTE — ED Provider Notes (Signed)
[facility]   CSN: [id number] Arrival date & time: 02/25/19  1821    History   Chief Complaint Chief Complaint  Patient presents with  . Shortness of Breath    HPI Jeremy Baker is a [age] y.o. male.     Jeremy MangoDajohn Bubeck is a 23 y.o. male with a history of asthma, hypertension, diabetes, depression and obesity, who presents to the emergency department for evaluation of shortness of breath and left-sided chest pain.  He reports symptoms started suddenly while he was at work today.  He reports shortness of breath started and then his chest began to feel very tight on the left side.  He reports he has a history of asthma but has not had an asthma attack in many years and did not have an albuterol inhaler with him.  He reports no associated fever but has had a little bit of cough, nasal congestion and sore throat.  He reports that his chest pain has resolved but he still has a little bit of shortness of breath and chest tightness.  He denies any lower extremity swelling or pain, no prior history of DVT or PE, no recent long distance travel or surgery.  No prior history of heart issues, takes his blood pressure medication regularly with good blood pressure control.  Denies smoking, and denies any other drug use.  No medications prior to arrival, no other aggravating or alleviating factors.  No known COVID-19 exposures or other sick exposures.     Past Medical History:  Diagnosis Date  . Asthma   . Depression   . Diabetes mellitus without complication (HCC)   . Hypertension   . Obesity     Patient Active Problem List   Diagnosis Date Noted  . Weakness of extremity 07/31/2018  . Depression 07/31/2018  . Hypertension   . Diabetes mellitus without complication (HCC)   . Obesity   . Asthma     Past Surgical History:  Procedure Laterality Date  . APPENDECTOMY          Home Medications    Prior to Admission medications    Medication Sig Start Date End Date Taking? Authorizing Provider  lamoTRIgine (LAMICTAL) 100 MG tablet Take 100 mg by mouth 2 (two) times daily.    [provider]  predniSONE (DELTASONE) 20 MG tablet Take 3 tablets (60 mg total) by mouth daily for 5 days. 02/25/19 03/02/19  Dartha LodgeFord, Crickett Abbett N, PA-C  topiramate (TOPAMAX) 50 MG tablet Take 50 mg by mouth at bedtime.  06/14/16   [provider]    Family History Family History  Problem Relation Age of Onset  . Diabetes Mother   . Hypertension Mother   . Heart disease Mother   . Kidney failure Father   . Diabetes Father   . Heart disease Maternal Grandmother   . Cancer Maternal Grandfather   . Kidney failure Paternal Grandfather     Social History Social History   Tobacco Use  . Smoking status: Never Smoker  . Smokeless tobacco: Never Used  Substance Use Topics  . Alcohol use: No  . Drug use: No     Allergies   Aspirin and Sertraline   Review of Systems Review of Systems  Constitutional: Negative for chills and fever.  HENT: Positive for congestion, rhinorrhea and sore throat.   Eyes: Negative for visual disturbance.  Respiratory: Positive for cough, chest tightness and shortness of breath.   Cardiovascular: Positive for chest pain.  Negative for palpitations and leg swelling.  Gastrointestinal: Negative for abdominal pain, diarrhea, nausea and vomiting.  Genitourinary: Negative for dysuria and frequency.  Musculoskeletal: Negative for arthralgias and myalgias.  Skin: Negative for color change and rash.  Neurological: Negative for dizziness and light-headedness.     Physical Exam Updated Vital Signs BP 121/82 (BP Location: Left Arm)   Pulse 86   Temp 99 F (37.2 C) (Oral)   Resp (!) 22   SpO2 97%   Physical Exam Vitals signs and nursing note reviewed.  Constitutional:      General: He is not in acute distress.    Appearance: He is well-developed. He is obese. He is not ill-appearing or  diaphoretic.  HENT:     Head: Normocephalic and atraumatic.     Nose: Congestion and rhinorrhea present.     Mouth/Throat:     Mouth: Mucous membranes are moist.     Pharynx: Posterior oropharyngeal erythema present.     Comments: Posterior oropharynx clear and mucous membranes moist, there is mild erythema and slight tonsillar edema, but tonsillar exudates, uvula midline, normal phonation, no trismus, tolerating secretions without difficulty.  Eyes:     General:        Right eye: No discharge.        Left eye: No discharge.  Neck:     Musculoskeletal: Neck supple.     Comments: No rigidity Cardiovascular:     Rate and Rhythm: Normal rate and regular rhythm.     Pulses: Normal pulses.     Heart sounds: Normal heart sounds. No murmur. No friction rub. No gallop.   Pulmonary:     Effort: Pulmonary effort is normal. No respiratory distress.     Breath sounds: Normal breath sounds.     Comments: Respirations equal and unlabored, patient able to speak in full sentences, lungs clear to auscultation bilaterally, but lungs sound tight with slightly decreased air movement Abdominal:     General: Bowel sounds are normal. There is no distension.     Palpations: Abdomen is soft. There is no mass.     Tenderness: There is no abdominal tenderness. There is no guarding.     Comments: Abdomen soft, nondistended, nontender to palpation in all quadrants without guarding or peritoneal signs  Musculoskeletal:        General: No deformity.  Lymphadenopathy:     Cervical: No cervical adenopathy.  Skin:    General: Skin is warm and dry.     Capillary Refill: Capillary refill takes less than 2 seconds.  Neurological:     Mental Status: He is alert.  Psychiatric:        Mood and Affect: Mood normal.        Behavior: Behavior normal.      ED Treatments / Results  Labs (all labs ordered are listed, but only abnormal results are displayed) Labs Reviewed  BASIC METABOLIC PANEL - Abnormal; Notable  for the following components:      Result Value   Chloride 112 (*)    CO2 20 (*)    Creatinine, Ser 1.31 (*)    All other components within normal limits  GROUP A STREP BY PCR  NOVEL CORONAVIRUS, NAA (HOSPITAL ORDER, SEND-OUT TO REF LAB)  CBC  TROPONIN I  D-DIMER, QUANTITATIVE (NOT [facility])    EKG EKG Interpretation  Date/Time:  Wednesday February 25 2019 18:34:37 EDT Ventricular Rate:  92 PR Interval:  142 QRS Duration: 82 QT Interval:  328 QTC  Calculation: 405 R Axis:   83 Text Interpretation:  Normal sinus rhythm Possible Left atrial enlargement Septal infarct , age undetermined T wave abnormality, consider inferior ischemia Abnormal ECG Confirmed by Lacretia Leigh ([id number]) on 02/25/2019 8:24:13 PM   Radiology Dg Chest 2 View  Result Date: 02/25/2019 CLINICAL DATA:  Chest pain. Sudden onset of left-sided chest pain and shortness of breath. EXAM: CHEST - 2 VIEW COMPARISON:  March 20, 2018 FINDINGS: The heart size and mediastinal contours are within normal limits. Both lungs are clear. The visualized skeletal structures are unremarkable. IMPRESSION: No active cardiopulmonary disease. Electronically Signed   By: Constance Holster M.D.   On: 02/25/2019 19:20    Procedures Procedures (including critical care time)  Medications Ordered in ED Medications  sodium chloride flush (NS) 0.9 % injection 3 mL (has no administration in time range)  albuterol (VENTOLIN HFA) 108 (90 Base) MCG/ACT inhaler 4 puff (4 puffs Inhalation Given 02/25/19 2221)  aerochamber plus with mask device 1 each (1 each Other Given 02/25/19 2223)  acetaminophen (TYLENOL) tablet 650 mg (650 mg Oral Given 02/25/19 2220)     Initial Impression / Assessment and Plan / ED Course  I have reviewed the triage vital signs and the nursing notes.  Pertinent labs & imaging results that were available during my care of the patient were reviewed by me and considered in my medical decision making (see chart for details).   Patient presents for evaluation of shortness of breath and chest tightness that started suddenly at work.  He is also experiencing some mild cough, rhinorrhea and sore throat over the past 2 days.  No fevers.  Vitals normal on arrival.  Patient in no acute respiratory distress.  Lungs are clear but do sound a bit tight.  He does have a history of asthma but has not had exacerbation in a long time, does not have an inhaler at home.  No history of ACS and symptoms seem atypical for ACS patient does have hypertension and diabetes but is young with no prior history of heart disease.  EKG some nonspecific T wave abnormalities but troponin is negative and I feel symptoms are atypical.  He does not have risk factors for DVT or PE but given symptoms will check d-dimer, basic labs reassuring, no leukocytosis, slightly elevated creatinine, will have patient follow-up with PCP regarding this, no other significant electrolyte derangements requiring intervention.  We will also send strep test and send out coronavirus test.  Patient's chest x-ray is clear.  After receiving albuterol patient reports significant improvement in his symptoms.  His strep test was negative as well.  Linus Orn test is pending patient will quarantine at home until COVID test returns, I have discussed with him that if this is positive he can return to work.  Will treat with steroid burst and continued use of albuterol inhaler at home for mild asthma exacerbation.  Return precautions discussed.  Patient expresses understanding and agreement with plan.  Discharged home in good condition.  Tylique Aull was evaluated in Emergency Department on 02/27/2019 for the symptoms described in the history of present illness. He was evaluated in the context of the global COVID-19 pandemic, which necessitated consideration that the patient might be at risk for infection with the SARS-CoV-2 virus that causes COVID-19. Institutional protocols and algorithms that pertain  to the evaluation of patients at risk for COVID-19 are in a state of rapid change based on information released by regulatory bodies including the CDC and federal and  state organizations. These policies and algorithms were followed during the patient's care in the ED.   Final Clinical Impressions(s) / ED Diagnoses   Final diagnoses:  SOB (shortness of breath)  Exacerbation of asthma, unspecified asthma severity, unspecified whether persistent  Suspected Covid-19 Virus Infection    ED Discharge Orders         Ordered    predniSONE (DELTASONE) 20 MG tablet  Daily     02/25/19 2338           Dartha LodgeFord, Domonique Cothran N, PA-C 02/27/19 1639    Lorre NickAllen, Anthony, MD 03/02/19 1025

## 2019-02-27 LAB — NOVEL CORONAVIRUS, NAA (HOSP ORDER, SEND-OUT TO REF LAB; TAT 18-24 HRS): SARS-CoV-2, NAA: NOT DETECTED

## 2019-03-18 IMAGING — MR MR MRA HEAD W/O CM
9 of 11 series · 33 of 48 positions shown · non-contrast
Comparison: None.

CLINICAL DATA: Acute onset LEFT-sided weakness, chest pain this
afternoon. Altered mental status. History of hypertension and
diabetes.

EXAM:
MRI HEAD WITHOUT CONTRAST
MRA HEAD WITHOUT CONTRAST
TECHNIQUE: Multiplanar, multiecho pulse sequences of the brain and surrounding
structures were obtained without intravenous contrast. Angiographic
images of the head were obtained using MRA technique without
contrast.

[Series 5: DWI · axial · 4.0mm · 0.96mm/px · z∈[-73,+73]mm · 7 of 76 slices shown (1 of 4)]
[im 1/76]
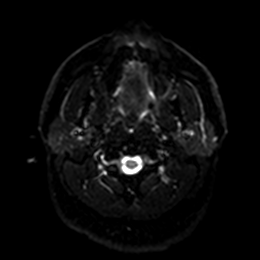
[im 13/76]
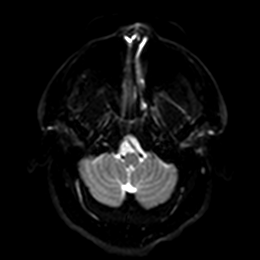
[im 26/76]
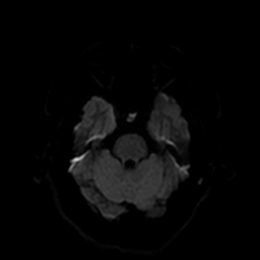
[im 38/76]
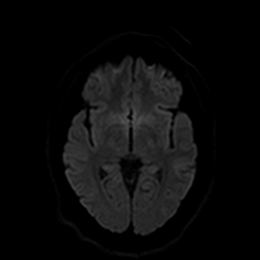
[im 51/76]
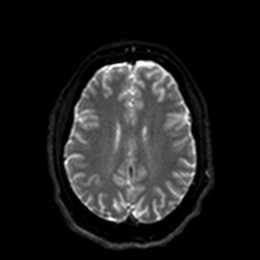
[im 63/76]
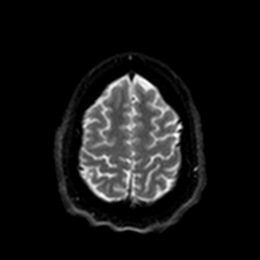
[im 76/76]
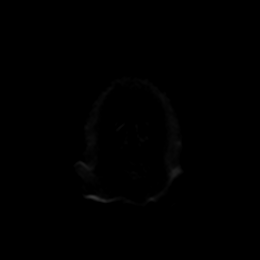

[Series 6: DWI · axial · 4.0mm · 0.96mm/px · z∈[-73,+73]mm · 3 of 38 slices shown (2 of 4)]
[im 1/38]
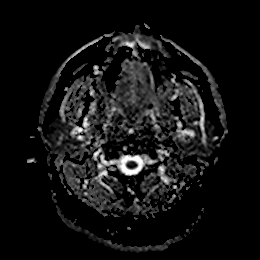
[im 19/38]
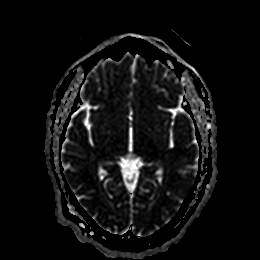
[im 38/38]
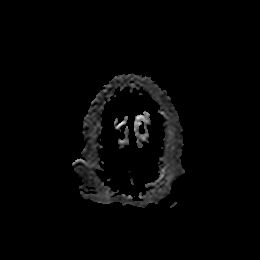

[Series 11: DWI · coronal · 4.0mm · 0.88mm/px · 6 of 68 slices shown (3 of 4)]
[im 1/68]
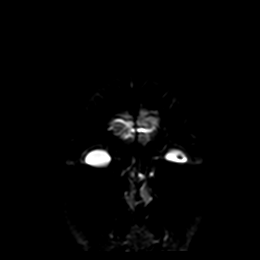
[im 14/68]
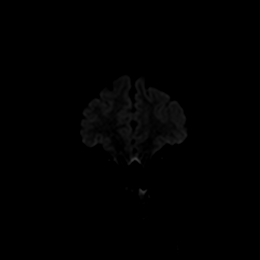
[im 27/68]
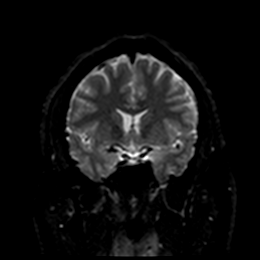
[im 41/68]
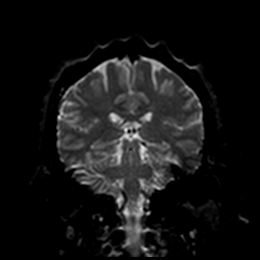
[im 54/68]
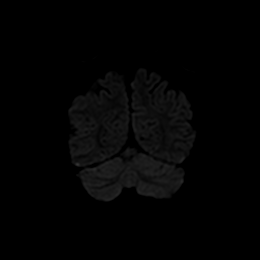
[im 68/68]
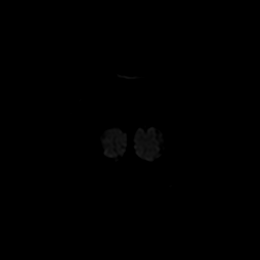

[Series 12: DWI · coronal · 4.0mm · 0.88mm/px · 3 of 34 slices shown (4 of 4)]
[im 1/34]
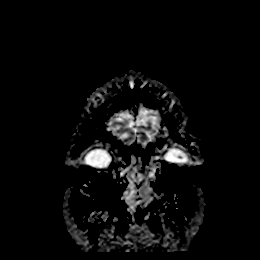
[im 17/34]
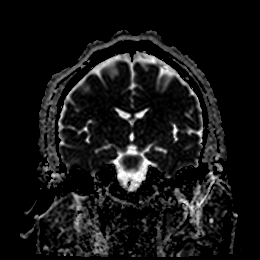
[im 34/34]
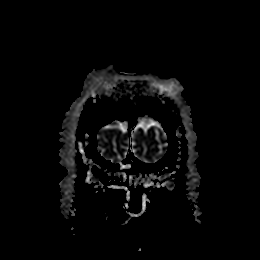

[Series 13: FLAIR · axial · 5.0mm · 0.47mm/px · z∈[-76,+66]mm · 2 of 25 slices shown]
[im 1/25]
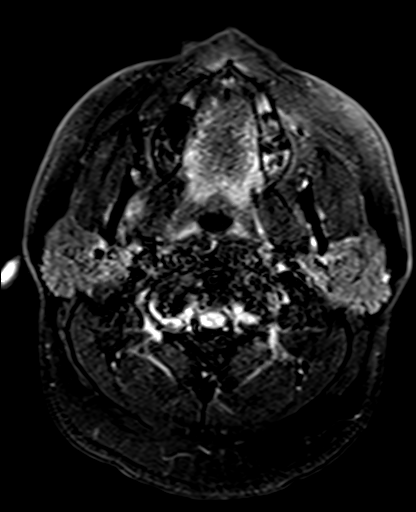
[im 25/25]
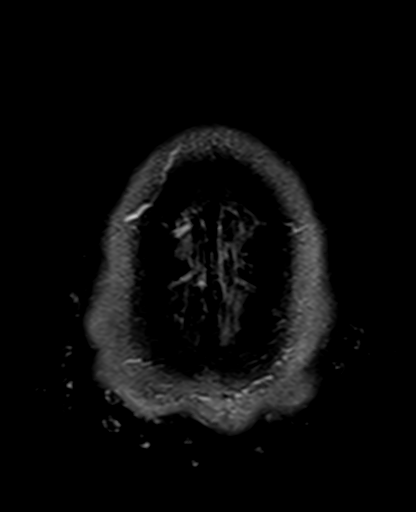

[Series 14: swi_images · axial · 3.0mm · 0.94mm/px · z∈[-82,+70]mm · 4 of 52 slices shown]
[im 1/52]
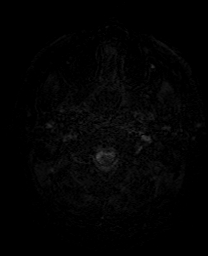
[im 18/52]
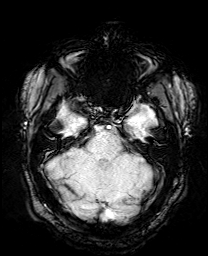
[im 35/52]
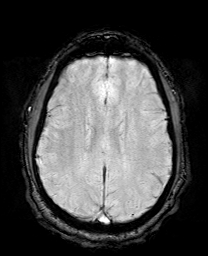
[im 52/52]
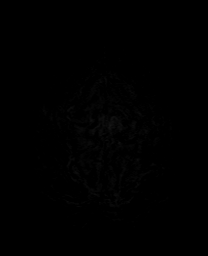

[Series 15: mip_images(sw) · axial · 24.0mm · 0.94mm/px · z∈[-71,+59]mm · 4 of 45 slices shown]
[im 1/45]
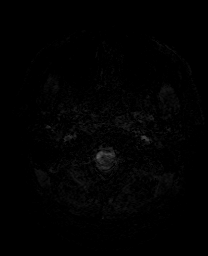
[im 15/45]
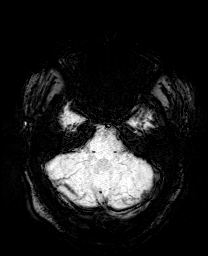
[im 30/45]
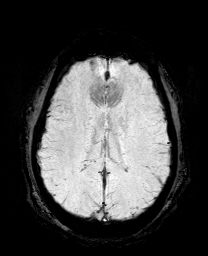
[im 45/45]
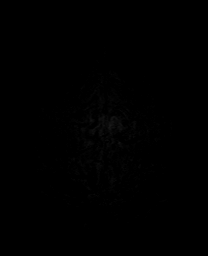

[Series 17: T1 · sagittal · 5.0mm · 0.81mm/px · 2 of 23 slices shown]
[im 1/23]
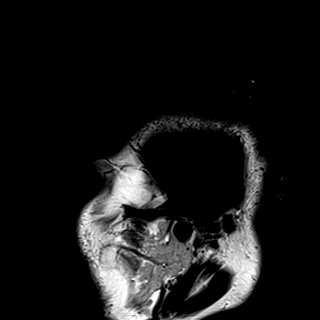
[im 23/23]
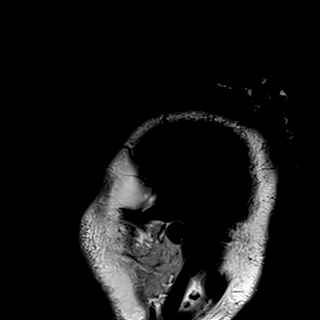

[Series 19: T2 · coronal · 5.0mm · 0.34mm/px · 2 of 29 slices shown]
[im 1/29]
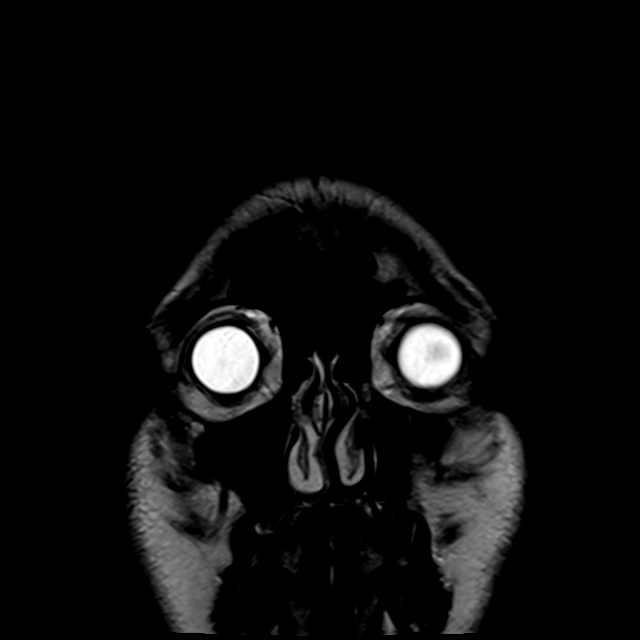
[im 29/29]
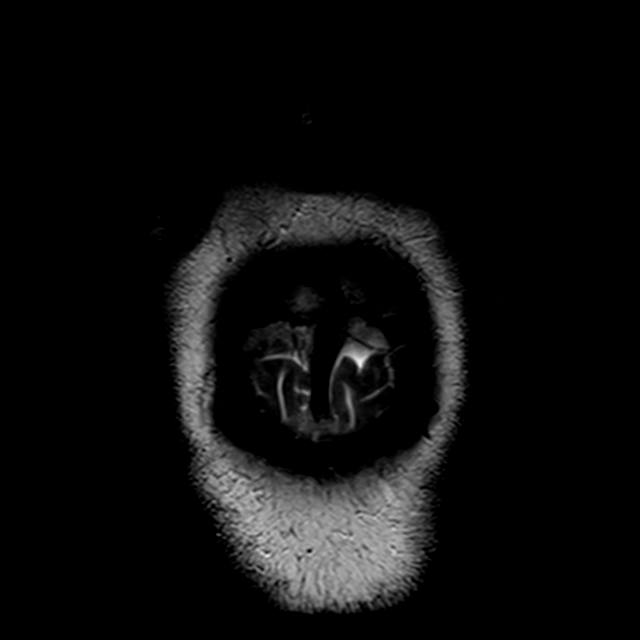

[33 of 48 positions shown; findings below may reference images not displayed]

FINDINGS: MRI HEAD FINDINGS

INTRACRANIAL CONTENTS: No reduced diffusion to suggest acute
ischemia or hyperacute demyelination. No susceptibility artifact to
suggest hemorrhage. The ventricles and sulci are normal for
patient's age. No suspicious parenchymal signal, masses, mass
effect. No abnormal extra-axial fluid collections. No extra-axial
masses.

VASCULAR: Normal major intracranial vascular flow voids present at
skull base.

SKULL AND UPPER CERVICAL SPINE: No abnormal sellar expansion. No
suspicious calvarial bone marrow signal. Craniocervical junction
maintained.

SINUSES/ORBITS: The mastoid air-cells and included paranasal sinuses
are well-aerated.The included ocular globes and orbital contents are
non-suspicious.

OTHER: None.

MRA HEAD FINDINGS

ANTERIOR CIRCULATION: Normal flow related enhancement of the
included cervical, petrous, cavernous and supraclinoid internal
carotid arteries. Patent anterior communicating artery. Patent
anterior and middle cerebral arteries.

No large vessel occlusion, flow limiting stenosis, aneurysm.

POSTERIOR CIRCULATION: Codominant vertebral arteries.
Vertebrobasilar arteries are patent, with normal flow related
enhancement of the main branch vessels. Patent posterior cerebral
arteries.

No large vessel occlusion, flow limiting stenosis,  aneurysm.

ANATOMIC VARIANTS: None.

Source images and MIP images were reviewed.
IMPRESSION: 1. Normal noncontrast MRI head.
2. Normal noncontrast MRA head.

## 2020-06-02 ENCOUNTER — Other Ambulatory Visit: Payer: Self-pay

## 2020-06-02 ENCOUNTER — Telehealth (INDEPENDENT_AMBULATORY_CARE_PROVIDER_SITE_OTHER): Payer: No Payment, Other | Admitting: Psychiatry

## 2020-06-02 ENCOUNTER — Encounter (HOSPITAL_COMMUNITY): Payer: Self-pay | Admitting: Psychiatry

## 2020-06-02 DIAGNOSIS — F3181 Bipolar II disorder: Secondary | ICD-10-CM | POA: Diagnosis not present

## 2020-06-02 MED ORDER — TOPIRAMATE 50 MG PO TABS: 50.0000 mg | ORAL_TABLET | Freq: Every day | ORAL | 1 refills | Status: DC

## 2020-06-02 MED ORDER — LAMOTRIGINE 100 MG PO TABS: 100.0000 mg | ORAL_TABLET | Freq: Two times a day (BID) | ORAL | 1 refills | Status: DC

## 2020-06-02 NOTE — Progress Notes (Signed)
Psychiatric Initial Adult Assessment   Virtual Visit via Video Note  I connected with Jeremy Baker on 06/02/20 at  1:40 PM EDT by a video enabled telemedicine application and verified that I am speaking with the correct person using two identifiers.  Location: Patient: Home Provider: Clinic   I discussed the limitations of evaluation and management by telemedicine and the availability of in person appointments. The patient expressed understanding and agreed to proceed.  I provided 18 minutes of non-face-to-face time during this encounter.     Patient Identification: Jeremy Baker MRN:  [id number] Date of Evaluation:  06/02/2020   Referral Source: Vesta Mixer  Chief Complaint:   " I am doing well."  Visit Diagnosis:    ICD-10-CM   1. Bipolar 2 disorder (HCC)  F31.81     History of Present Illness: This is a 24 year old male with history of bipolar 2 disorder now seen for establishing care.  Patient was being followed up at [facility] and was being prescribed Lamictal 100 mg twice daily and Topamax 50 mg at bedtime. Patient reported that he has been on these medications for more than 3 years now and everything has been going well for him.  He stated that these medications have been helpful in maintaining his mood.  His mood has been stable and he sleeps well at night.  He denied any concerns or issues.   He works Monday to Saturday for [employer].  He stated that he is mostly busy with work.  He does not have any children and is not in a relationship at present.  He lives with his parents. He denied any ongoing symptoms suggestive of mania or hypomania.  He denied any symptom suggestive of ongoing depression.  He denied any psychotic symptoms. He denied any suicidal or homicidal ideations.   Past Psychiatric History: Bipolar disorder  Previous Psychotropic Medications: Yes   Substance Abuse History in the last 12 months:  No.  Consequences of Substance Abuse: NA  Past  Medical History:  Past Medical History:  Diagnosis Date  . Asthma   . Depression   . Diabetes mellitus without complication (HCC)   . Hypertension   . Obesity     Past Surgical History:  Procedure Laterality Date  . APPENDECTOMY      Family Psychiatric History: denied  Family History:  Family History  Problem Relation Age of Onset  . Diabetes Mother   . Hypertension Mother   . Heart disease Mother   . Kidney failure Father   . Diabetes Father   . Heart disease Maternal Grandmother   . Cancer Maternal Grandfather   . Kidney failure Paternal Grandfather     Social History:   Social History   Socioeconomic History  . Marital status: Single    Spouse name: Not on file  . Number of children: Not on file  . Years of education: Not on file  . Highest education level: Not on file  Occupational History  . Not on file  Tobacco Use  . Smoking status: Never Smoker  . Smokeless tobacco: Never Used  Substance and Sexual Activity  . Alcohol use: No  . Drug use: No  . Sexual activity: Not on file  Other Topics Concern  . Not on file  Social History Narrative  . Not on file   Social Determinants of Health   Financial Resource Strain:   . Difficulty of Paying Living Expenses: Not on file  Food Insecurity:   . Worried About Running  Out of Food in the Last Year: Not on file  . Ran Out of Food in the Last Year: Not on file  Transportation Needs:   . Lack of Transportation (Medical): Not on file  . Lack of Transportation (Non-Medical): Not on file  Physical Activity:   . Days of Exercise per Week: Not on file  . Minutes of Exercise per Session: Not on file  Stress:   . Feeling of Stress : Not on file  Social Connections:   . Frequency of Communication with Friends and Family: Not on file  . Frequency of Social Gatherings with Friends and Family: Not on file  . Attends Religious Services: Not on file  . Active Member of Clubs or Organizations: Not on file  . Attends  Banker Meetings: Not on file  . Marital Status: Not on file    Additional Social History: Single, works for [employer].  Lives with his parents.  Allergies:   Allergies  Allergen Reactions  . Aspirin Other (See Comments)    Nose bleeds   . Sertraline Nausea Only and Other (See Comments)    "Solution" form causes nausea     Metabolic Disorder Labs: Lab Results  Component Value Date   HGBA1C 5.4 07/31/2018   MPG 108.28 07/31/2018   No results found for: PROLACTIN Lab Results  Component Value Date   CHOL 163 08/01/2018   TRIG 73 08/01/2018   HDL 23 (L) 08/01/2018   CHOLHDL 7.1 08/01/2018   VLDL 15 08/01/2018   LDLCALC 125 (H) 08/01/2018   No results found for: TSH  Therapeutic Level Labs: No results found for: LITHIUM No results found for: CBMZ No results found for: VALPROATE  Current Medications: Current Outpatient Medications  Medication Sig Dispense Refill  . lamoTRIgine (LAMICTAL) 100 MG tablet Take 100 mg by mouth 2 (two) times daily.    Marland Kitchen topiramate (TOPAMAX) 50 MG tablet Take 50 mg by mouth at bedtime.      No current facility-administered medications for this visit.     Psychiatric Specialty Exam: Review of Systems  There were no vitals taken for this visit.There is no height or weight on file to calculate BMI.  General Appearance: Fairly Groomed  Eye Contact:  Good  Speech:  Clear and Coherent and Normal Rate  Volume:  Normal  Mood:  Euthymic  Affect:  Congruent  Thought Process:  Goal Directed and Descriptions of Associations: Intact  Orientation:  Full (Time, Place, and Person)  Thought Content:  Logical  Suicidal Thoughts:  No  Homicidal Thoughts:  No  Memory:  Immediate;   Good Recent;   Good  Judgement:  Fair  Insight:  Fair  Psychomotor Activity:  Normal  Concentration:  Concentration: Good and Attention Span: Good  Recall:  Good  Fund of Knowledge:Good  Language: Good  Akathisia:  Negative  Handed:  Right   AIMS (if indicated):  Not done  Assets:  Communication Skills Desire for Improvement Financial Resources/Insurance Housing Social Support Talents/Skills Transportation Vocational/Educational  ADL's:  Intact  Cognition: WNL  Sleep:  Good     Assessment and Plan: 24 year old male with history of bipolar disorder now seen for establishing care.  Patient is stable on his current regimen.  1. Bipolar 2 disorder (HCC)  - lamoTRIgine (LAMICTAL) 100 MG tablet; Take 1 tablet (100 mg total) by mouth 2 (two) times daily.  Dispense: 90 tablet; Refill: 1 - topiramate (TOPAMAX) 50 MG tablet; Take 1 tablet (50 mg total) by  mouth at bedtime.  Dispense: 90 tablet; Refill: 1  F/up in 2 months.  Zena Amos, MD 9/23/20211:46 PM

## 2020-08-25 ENCOUNTER — Telehealth (HOSPITAL_COMMUNITY): Payer: Self-pay

## 2020-08-25 ENCOUNTER — Other Ambulatory Visit: Payer: Self-pay

## 2020-08-25 ENCOUNTER — Telehealth (HOSPITAL_COMMUNITY): Payer: No Payment, Other | Admitting: Psychiatry

## 2020-08-25 DIAGNOSIS — F3181 Bipolar II disorder: Secondary | ICD-10-CM

## 2020-08-25 MED ORDER — LAMOTRIGINE 100 MG PO TABS: 100.0000 mg | ORAL_TABLET | Freq: Two times a day (BID) | ORAL | 0 refills | Status: DC

## 2020-08-25 MED ORDER — TOPIRAMATE 50 MG PO TABS: 50.0000 mg | ORAL_TABLET | Freq: Every day | ORAL | 0 refills | Status: DC

## 2020-08-25 NOTE — Telephone Encounter (Signed)
Refill sent.

## 2020-10-03 ENCOUNTER — Other Ambulatory Visit: Payer: Self-pay

## 2020-10-03 ENCOUNTER — Encounter (HOSPITAL_COMMUNITY): Payer: Self-pay | Admitting: Psychiatry

## 2020-10-03 ENCOUNTER — Telehealth (INDEPENDENT_AMBULATORY_CARE_PROVIDER_SITE_OTHER): Payer: No Payment, Other | Admitting: Psychiatry

## 2020-10-03 DIAGNOSIS — F3181 Bipolar II disorder: Secondary | ICD-10-CM | POA: Diagnosis not present

## 2020-10-03 MED ORDER — LAMOTRIGINE 100 MG PO TABS: 100.0000 mg | ORAL_TABLET | Freq: Two times a day (BID) | ORAL | 0 refills | Status: DC

## 2020-10-03 MED ORDER — TOPIRAMATE 50 MG PO TABS: 50.0000 mg | ORAL_TABLET | Freq: Every day | ORAL | 0 refills | Status: DC

## 2020-10-03 NOTE — Addendum Note (Signed)
Addended by: Zena Amos on: 10/03/2020 12:41 PM   Modules accepted: Orders

## 2020-10-03 NOTE — Progress Notes (Signed)
BH OP Progress Note   Virtual Visit via Telephone Note  I connected with Jeremy Baker on 10/03/20 at  8:20 AM EST by telephone and verified that I am speaking with the correct person using two identifiers.  Location: Patient: home Provider: Clinic   I discussed the limitations, risks, security and privacy concerns of performing an evaluation and management service by telephone and the availability of in person appointments. I also discussed with the patient that there may be a patient responsible charge related to this service. The patient expressed understanding and agreed to proceed.   I provided 14 minutes of non-face-to-face time during this encounter.     Patient Identification: Jeremy Baker MRN:  [id number] Date of Evaluation:  10/03/2020    Chief Complaint:   " Everything is fine."  Visit Diagnosis:    ICD-10-CM   1. Bipolar 2 disorder (HCC)  F31.81     History of Present Illness: Patient reported that he is doing well.  He informed that his mood and sleep have been stable.  He still working and staying busy with his job.  He informed that he had a good Christmas holiday season and everything in his family is going well. He requested refills for the same regimen as it has been helping him for a while now. He denied any other concerns at this time.  Past Psychiatric History: Bipolar disorder  Previous Psychotropic Medications: Yes   Substance Abuse History in the last 12 months:  No.  Consequences of Substance Abuse: NA  Past Medical History:  Past Medical History:  Diagnosis Date  . Asthma   . Depression   . Diabetes mellitus without complication (HCC)   . Hypertension   . Obesity     Past Surgical History:  Procedure Laterality Date  . APPENDECTOMY      Family Psychiatric History: denied  Family History:  Family History  Problem Relation Age of Onset  . Diabetes Mother   . Hypertension Mother   . Heart disease Mother   . Kidney failure  Father   . Diabetes Father   . Heart disease Maternal Grandmother   . Cancer Maternal Grandfather   . Kidney failure Paternal Grandfather     Social History:   Social History   Socioeconomic History  . Marital status: Single    Spouse name: Not on file  . Number of children: Not on file  . Years of education: Not on file  . Highest education level: Not on file  Occupational History  . Not on file  Tobacco Use  . Smoking status: Never Smoker  . Smokeless tobacco: Never Used  Substance and Sexual Activity  . Alcohol use: No  . Drug use: No  . Sexual activity: Not on file  Other Topics Concern  . Not on file  Social History Narrative  . Not on file   Social Determinants of Health   Financial Resource Strain: Not on file  Food Insecurity: Not on file  Transportation Needs: Not on file  Physical Activity: Not on file  Stress: Not on file  Social Connections: Not on file    Additional Social History: Single, works for [employer].  Lives with his parents.  Allergies:   Allergies  Allergen Reactions  . Aspirin Other (See Comments)    Nose bleeds   . Sertraline Nausea Only and Other (See Comments)    "Solution" form causes nausea     Metabolic Disorder Labs: Lab Results  Component Value Date  HGBA1C 5.4 07/31/2018   MPG 108.28 07/31/2018   No results found for: PROLACTIN Lab Results  Component Value Date   CHOL 163 08/01/2018   TRIG 73 08/01/2018   HDL 23 (L) 08/01/2018   CHOLHDL 7.1 08/01/2018   VLDL 15 08/01/2018   LDLCALC 125 (H) 08/01/2018   No results found for: TSH  Therapeutic Level Labs: No results found for: LITHIUM No results found for: CBMZ No results found for: VALPROATE  Current Medications: Current Outpatient Medications  Medication Sig Dispense Refill  . lamoTRIgine (LAMICTAL) 100 MG tablet Take 1 tablet (100 mg total) by mouth 2 (two) times daily. 180 tablet 0  . topiramate (TOPAMAX) 50 MG tablet Take 1 tablet (50 mg total)  by mouth at bedtime. 90 tablet 0   No current facility-administered medications for this visit.     Psychiatric Specialty Exam: Review of Systems  There were no vitals taken for this visit.There is no height or weight on file to calculate BMI.  General Appearance: Unable to assess due to phone visit  Eye Contact:  Unable to assess due to phone visit  Speech:  Clear and Coherent and Normal Rate  Volume:  Normal  Mood:  Euthymic  Affect:  Congruent  Thought Process:  Goal Directed and Descriptions of Associations: Intact  Orientation:  Full (Time, Place, and Person)  Thought Content:  Logical  Suicidal Thoughts:  No  Homicidal Thoughts:  No  Memory:  Immediate;   Good Recent;   Good  Judgement:  Fair  Insight:  Fair  Psychomotor Activity:  Normal  Concentration:  Concentration: Good and Attention Span: Good  Recall:  Good  Fund of Knowledge:Good  Language: Good  Akathisia:  Negative  Handed:  Right  AIMS (if indicated):  Not done  Assets:  Communication Skills Desire for Improvement Financial Resources/Insurance Housing Social Support Talents/Skills Transportation Vocational/Educational  ADL's:  Intact  Cognition: WNL  Sleep:  Good     Assessment and Plan: Patient appears to be stable on his current regimen.  We will keep the same medication combination for now.  1. Bipolar 2 disorder (HCC)  - lamoTRIgine (LAMICTAL) 100 MG tablet; Take 1 tablet (100 mg total) by mouth 2 (two) times daily.  Dispense: 90 tablet; Refill: 1 - topiramate (TOPAMAX) 50 MG tablet; Take 1 tablet (50 mg total) by mouth at bedtime.  Dispense: 90 tablet; Refill: 1   Continue same medication regimen. Follow up in 3 months.   Zena Amos, MD 1/24/20228:25 AM

## 2020-12-29 ENCOUNTER — Other Ambulatory Visit: Payer: Self-pay

## 2020-12-29 ENCOUNTER — Encounter (HOSPITAL_COMMUNITY): Payer: Self-pay | Admitting: Psychiatry

## 2020-12-29 ENCOUNTER — Telehealth (INDEPENDENT_AMBULATORY_CARE_PROVIDER_SITE_OTHER): Payer: No Payment, Other | Admitting: Psychiatry

## 2020-12-29 DIAGNOSIS — F3181 Bipolar II disorder: Secondary | ICD-10-CM

## 2020-12-29 MED ORDER — TOPIRAMATE 50 MG PO TABS: 50.0000 mg | ORAL_TABLET | Freq: Every day | ORAL | 0 refills | Status: DC

## 2020-12-29 MED ORDER — LAMOTRIGINE 100 MG PO TABS: 100.0000 mg | ORAL_TABLET | Freq: Two times a day (BID) | ORAL | 0 refills | Status: DC

## 2020-12-29 NOTE — Progress Notes (Signed)
BH OP Progress Note   Virtual Visit via Video Note  I connected with Jeremy Baker on 12/29/20 at  9:00 AM EDT by a video enabled telemedicine application and verified that I am speaking with the correct person using two identifiers.  Location: Patient: Home Provider: Clinic   I discussed the limitations of evaluation and management by telemedicine and the availability of in person appointments. The patient expressed understanding and agreed to proceed.  I provided 16 minutes of non-face-to-face time during this encounter.    Patient Identification: Jeremy Baker MRN:  [id number] Date of Evaluation:  12/29/2020    Chief Complaint:   " I am doing okay for the most part."  Visit Diagnosis:    ICD-10-CM   1. Bipolar 2 disorder (HCC)  F31.81     History of Present Illness: Patient reported he is doing well overall.  He informed that he has had a few rough days due to stressful situations at work and other things.  However for the most part he is managing well.  He stated the combination of Lamictal and Topamax keeps his mood leveled out and he thinks the medicines are helpful. He stated that lately he has started having difficulty in falling asleep which is mainly because of change in his work shift.  He is going to work later in the day and as a result gets home later. Is trying to get used to the change in his work shifts. Writer suggested that he can try melatonin at bedtime to see if that helps him fall asleep.  Patient stated he will try that. He denied any other concerns or issues today.    Past Psychiatric History: Bipolar disorder  Previous Psychotropic Medications: Yes   Substance Abuse History in the last 12 months:  No.  Consequences of Substance Abuse: NA  Past Medical History:  Past Medical History:  Diagnosis Date  . Asthma   . Depression   . Diabetes mellitus without complication (HCC)   . Hypertension   . Obesity     Past Surgical History:   Procedure Laterality Date  . APPENDECTOMY      Family Psychiatric History: denied  Family History:  Family History  Problem Relation Age of Onset  . Diabetes Mother   . Hypertension Mother   . Heart disease Mother   . Kidney failure Father   . Diabetes Father   . Heart disease Maternal Grandmother   . Cancer Maternal Grandfather   . Kidney failure Paternal Grandfather     Social History:   Social History   Socioeconomic History  . Marital status: Single    Spouse name: Not on file  . Number of children: Not on file  . Years of education: Not on file  . Highest education level: Not on file  Occupational History  . Not on file  Tobacco Use  . Smoking status: Never Smoker  . Smokeless tobacco: Never Used  Substance and Sexual Activity  . Alcohol use: No  . Drug use: No  . Sexual activity: Not on file  Other Topics Concern  . Not on file  Social History Narrative  . Not on file   Social Determinants of Health   Financial Resource Strain: Not on file  Food Insecurity: Not on file  Transportation Needs: Not on file  Physical Activity: Not on file  Stress: Not on file  Social Connections: Not on file    Additional Social History: Single, works for [employer].  Lives  with his parents.  Allergies:   Allergies  Allergen Reactions  . Aspirin Other (See Comments)    Nose bleeds   . Sertraline Nausea Only and Other (See Comments)    "Solution" form causes nausea     Metabolic Disorder Labs: Lab Results  Component Value Date   HGBA1C 5.4 07/31/2018   MPG 108.28 07/31/2018   No results found for: PROLACTIN Lab Results  Component Value Date   CHOL 163 08/01/2018   TRIG 73 08/01/2018   HDL 23 (L) 08/01/2018   CHOLHDL 7.1 08/01/2018   VLDL 15 08/01/2018   LDLCALC 125 (H) 08/01/2018   No results found for: TSH  Therapeutic Level Labs: No results found for: LITHIUM No results found for: CBMZ No results found for: VALPROATE  Current  Medications: Current Outpatient Medications  Medication Sig Dispense Refill  . lamoTRIgine (LAMICTAL) 100 MG tablet Take 1 tablet (100 mg total) by mouth 2 (two) times daily. 180 tablet 0  . topiramate (TOPAMAX) 50 MG tablet Take 1 tablet (50 mg total) by mouth at bedtime. 90 tablet 0   No current facility-administered medications for this visit.     Psychiatric Specialty Exam: Review of Systems  There were no vitals taken for this visit.There is no height or weight on file to calculate BMI.  General Appearance: Fairly Groomed and Guarded  Eye Contact:  Good  Speech:  Clear and Coherent and Normal Rate  Volume:  Normal  Mood:  Euthymic  Affect:  Congruent  Thought Process:  Goal Directed and Descriptions of Associations: Intact  Orientation:  Full (Time, Place, and Person)  Thought Content:  Logical  Suicidal Thoughts:  No  Homicidal Thoughts:  No  Memory:  Immediate;   Good Recent;   Good  Judgement:  Fair  Insight:  Fair  Psychomotor Activity:  Normal  Concentration:  Concentration: Good and Attention Span: Good  Recall:  Good  Fund of Knowledge:Good  Language: Good  Akathisia:  Negative  Handed:  Right  AIMS (if indicated):  Not done  Assets:  Communication Skills Desire for Improvement Financial Resources/Insurance Housing Social Support Talents/Skills Transportation Vocational/Educational  ADL's:  Intact  Cognition: WNL  Sleep:  Fair     Assessment and Plan: Patient seems to be doing well on his current regimen in terms of his mood stabilization.  Continue the same regimen for now.  1. Bipolar 2 disorder (HCC)  - lamoTRIgine (LAMICTAL) 100 MG tablet; Take 1 tablet (100 mg total) by mouth 2 (two) times daily.  Dispense: 90 tablet; Refill: 1 - topiramate (TOPAMAX) 50 MG tablet; Take 1 tablet (50 mg total) by mouth at bedtime.  Dispense: 90 tablet; Refill: 1   Continue same medication regimen. Follow up in 3 months.   Zena Amos, MD 4/21/20229:03  AM

## 2021-03-28 ENCOUNTER — Telehealth (INDEPENDENT_AMBULATORY_CARE_PROVIDER_SITE_OTHER): Payer: No Payment, Other | Admitting: Physician Assistant

## 2021-03-28 ENCOUNTER — Encounter (HOSPITAL_COMMUNITY): Payer: Self-pay | Admitting: Physician Assistant

## 2021-03-28 ENCOUNTER — Other Ambulatory Visit: Payer: Self-pay

## 2021-03-28 DIAGNOSIS — F411 Generalized anxiety disorder: Secondary | ICD-10-CM | POA: Insufficient documentation

## 2021-03-28 DIAGNOSIS — F3181 Bipolar II disorder: Secondary | ICD-10-CM

## 2021-03-28 MED ORDER — HYDROXYZINE HCL 10 MG PO TABS: 10.0000 mg | ORAL_TABLET | Freq: Three times a day (TID) | ORAL | 1 refills | Status: AC | PRN

## 2021-03-28 MED ORDER — TOPIRAMATE 50 MG PO TABS: 50.0000 mg | ORAL_TABLET | Freq: Every day | ORAL | 0 refills | Status: AC

## 2021-03-28 MED ORDER — LAMOTRIGINE 100 MG PO TABS: 100.0000 mg | ORAL_TABLET | Freq: Two times a day (BID) | ORAL | 0 refills | Status: AC

## 2021-03-28 NOTE — Progress Notes (Signed)
BH MD/PA/NP OP Progress Note  Virtual Visit via Video Note  I connected with Jeremy Baker on 03/28/21 at 11:30 AM EDT by a video enabled telemedicine application and verified that I am speaking with the correct person using two identifiers.  Location: Patient: Home Provider: Clinic   I discussed the limitations of evaluation and management by telemedicine and the availability of in person appointments. The patient expressed understanding and agreed to proceed.  Follow Up Instructions:  I discussed the assessment and treatment plan with the patient. The patient was provided an opportunity to ask questions and all were answered. The patient agreed with the plan and demonstrated an understanding of the instructions.   The patient was advised to call back or seek an in-person evaluation if the symptoms worsen or if the condition fails to improve as anticipated.  I provided 18 minutes of non-face-to-face time during this encounter.  Meta Hatchet, PA   03/28/2021 5:27 PM Jeremy Baker  MRN:  [id number]  Chief Complaint: Follow up and medication management  HPI:   Jeremy Baker is a 25 year old male with a past psychiatric history significant for bipolar II disorder who presents to [facility] via virtual video visit for follow-up and medication management.  Patient is currently being managed on the following medications:  Lamotrigine 100 mg 2 times daily Topiramate 50 mg at bedtime  Patient reports no issues regarding his current medication regimen.  He does express experiencing some drowsiness while on his medications but states that it is manageable.  Patient endorses a few depressive episodes the previous week but states that it was nothing major.  Patient endorses the following depressive symptoms: depressed mood, decreased energy, sleep disturbances, feelings of guilt and worthlessness, and changes in his appetite.  Patient denies  lack of motivation and irritability.  Patient further endorses anxiety and rates his anxiety on average 3-4 out of 10.  Patient states that his anxiety may occasionally reach up to 10.  Patient's main stressor includes changes in his work schedule.  A PHQ-9 screen was performed with the patient scoring an 11.  A GAD-7 screen was also performed with the patient scoring a 7.  Patient is pleasant, calm, cooperative, and fully engaged in conversation during the encounter.  Patient reports that his mood is okay.  Patient denies suicidal or homicidal ideations.  He further denies auditory or visual hallucinations and does not appear to be responding to internal/external stimuli.  Patient endorses fair sleep and states that he receives enough sleep in order for him to function the next day.  Patient endorses fair appetite and eats on average 2 meals per day.  Patient denies alcohol consumption, tobacco use, and illicit drug use.  Visit Diagnosis:    ICD-10-CM   1. Generalized anxiety disorder  F41.1 hydrOXYzine (ATARAX/VISTARIL) 10 MG tablet    2. Bipolar 2 disorder (HCC)  F31.81 lamoTRIgine (LAMICTAL) 100 MG tablet    topiramate (TOPAMAX) 50 MG tablet      Past Psychiatric History:  Bipolar 2 disorder Generalized anxiety disorder  Past Medical History:  Past Medical History:  Diagnosis Date   Asthma    Depression    Diabetes mellitus without complication (HCC)    Hypertension    Obesity     Past Surgical History:  Procedure Laterality Date   APPENDECTOMY      Family Psychiatric History:  Denied  Family History:  Family History  Problem Relation Age of Onset   Diabetes Mother  Hypertension Mother    Heart disease Mother    Kidney failure Father    Diabetes Father    Heart disease Maternal Grandmother    Cancer Maternal Grandfather    Kidney failure Paternal Grandfather     Social History:  Social History   Socioeconomic History   Marital status: Single    Spouse name:  Not on file   Number of children: Not on file   Years of education: Not on file   Highest education level: Not on file  Occupational History   Not on file  Tobacco Use   Smoking status: Never   Smokeless tobacco: Never  Substance and Sexual Activity   Alcohol use: No   Drug use: No   Sexual activity: Not on file  Other Topics Concern   Not on file  Social History Narrative   Not on file   Social Determinants of Health   Financial Resource Strain: Not on file  Food Insecurity: Not on file  Transportation Needs: Not on file  Physical Activity: Not on file  Stress: Not on file  Social Connections: Not on file    Allergies:  Allergies  Allergen Reactions   Aspirin Other (See Comments)    Nose bleeds    Sertraline Nausea Only and Other (See Comments)    "Solution" form causes nausea     Metabolic Disorder Labs: Lab Results  Component Value Date   HGBA1C 5.4 07/31/2018   MPG 108.28 07/31/2018   No results found for: PROLACTIN Lab Results  Component Value Date   CHOL 163 08/01/2018   TRIG 73 08/01/2018   HDL 23 (L) 08/01/2018   CHOLHDL 7.1 08/01/2018   VLDL 15 08/01/2018   LDLCALC 125 (H) 08/01/2018   No results found for: TSH  Therapeutic Level Labs: No results found for: LITHIUM No results found for: VALPROATE No components found for:  CBMZ  Current Medications: Current Outpatient Medications  Medication Sig Dispense Refill   hydrOXYzine (ATARAX/VISTARIL) 10 MG tablet Take 1 tablet (10 mg total) by mouth 3 (three) times daily as needed for anxiety. 75 tablet 1   lamoTRIgine (LAMICTAL) 100 MG tablet Take 1 tablet (100 mg total) by mouth 2 (two) times daily. 180 tablet 0   topiramate (TOPAMAX) 50 MG tablet Take 1 tablet (50 mg total) by mouth at bedtime. 90 tablet 0   No current facility-administered medications for this visit.     Musculoskeletal: Strength & Muscle Tone: Unable to assess due to telemedicine visit Gait & Station: Unable to assess  due to telemedicine visit Patient leans: Unable to assess due to telemedicine visit  Psychiatric Specialty Exam: Review of Systems  Psychiatric/Behavioral:  Positive for sleep disturbance. Negative for decreased concentration, dysphoric mood, hallucinations, self-injury and suicidal ideas. The patient is nervous/anxious. The patient is not hyperactive.    There were no vitals taken for this visit.There is no height or weight on file to calculate BMI.  General Appearance: Well Groomed  Eye Contact:  Good  Speech:  Clear and Coherent and Normal Rate  Volume:  Normal  Mood:  Anxious and Depressed  Affect:  Congruent and Depressed  Thought Process:  Coherent, Goal Directed, and Descriptions of Associations: Intact  Orientation:  Full (Time, Place, and Person)  Thought Content: WDL   Suicidal Thoughts:  No  Homicidal Thoughts:  No  Memory:  Immediate;   Good Recent;   Good Remote;   Good  Judgement:  Good  Insight:  Good  Psychomotor  Activity:  Normal  Concentration:  Concentration: Good and Attention Span: Good  Recall:  Good  Fund of Knowledge: Good  Language: Good  Akathisia:  Negative  Handed:  Right  AIMS (if indicated): not done  Assets:  Communication Skills Desire for Improvement Financial Resources/Insurance Housing Social Support Talents/Skills Vocational/Educational  ADL's:  Intact  Cognition: WNL  Sleep:  Fair   Screenings: GAD-7    Flowsheet Row Video Visit from 03/28/2021 in [facility]  Total GAD-7 Score 7      PHQ2-9    Flowsheet Row Video Visit from 03/28/2021 in [facility]  PHQ-2 Total Score 2  PHQ-9 Total Score 11      Flowsheet Row Video Visit from 03/28/2021 in [facility]  C-SSRS RISK CATEGORY Low Risk        Assessment and Plan:   Sina Lucchesi is a 25 year old male with a past psychiatric history significant for bipolar II disorder who presents  to [facility] via virtual video visit for follow-up and medication management.  Patient expresses that he has been experiencing depressive episodes that have been manageable as well as anxiety.  Patient reports no issues or concerns regarding his current medication regimen.  Patient was recommended hydroxyzine 10 mg 3 times daily as needed for the management of anxiety.  Patient was agreeable to recommendation.  Patient's medications to be e-prescribed to pharmacy of choice.  1. Bipolar 2 disorder (HCC)  - lamoTRIgine (LAMICTAL) 100 MG tablet; Take 1 tablet (100 mg total) by mouth 2 (two) times daily.  Dispense: 180 tablet; Refill: 0 - topiramate (TOPAMAX) 50 MG tablet; Take 1 tablet (50 mg total) by mouth at bedtime.  Dispense: 90 tablet; Refill: 0  2. Generalized anxiety disorder  - hydrOXYzine (ATARAX/VISTARIL) 10 MG tablet; Take 1 tablet (10 mg total) by mouth 3 (three) times daily as needed for anxiety.  Dispense: 75 tablet; Refill: 1  Patient to follow-up in 2 months A total of 18 minutes was spent with the patient/reviewing patient's chart  Meta Hatchet, PA 03/28/2021, 5:27 PM

## 2021-05-31 ENCOUNTER — Telehealth (HOSPITAL_COMMUNITY): Payer: No Payment, Other | Admitting: Physician Assistant

## 2023-10-06 ENCOUNTER — Encounter (HOSPITAL_COMMUNITY): Payer: Self-pay | Admitting: *Deleted

## 2023-10-06 ENCOUNTER — Other Ambulatory Visit: Payer: Self-pay

## 2023-10-06 ENCOUNTER — Emergency Department (HOSPITAL_COMMUNITY): Payer: No Typology Code available for payment source

## 2023-10-06 ENCOUNTER — Emergency Department (HOSPITAL_COMMUNITY)
Admission: EM | Admit: 2023-10-06 | Discharge: 2023-10-07 | Disposition: A | Discharge status: Home / Self Care | Payer: No Typology Code available for payment source | Attending: Emergency Medicine | Admitting: Emergency Medicine

## 2023-10-06 DIAGNOSIS — J45909 Unspecified asthma, uncomplicated: Secondary | ICD-10-CM | POA: Diagnosis not present

## 2023-10-06 DIAGNOSIS — R110 Nausea: Secondary | ICD-10-CM | POA: Insufficient documentation

## 2023-10-06 DIAGNOSIS — E119 Type 2 diabetes mellitus without complications: Secondary | ICD-10-CM | POA: Insufficient documentation

## 2023-10-06 DIAGNOSIS — M545 Low back pain, unspecified: Secondary | ICD-10-CM | POA: Insufficient documentation

## 2023-10-06 DIAGNOSIS — R1033 Periumbilical pain: Secondary | ICD-10-CM | POA: Diagnosis not present

## 2023-10-06 DIAGNOSIS — M546 Pain in thoracic spine: Secondary | ICD-10-CM | POA: Insufficient documentation

## 2023-10-06 DIAGNOSIS — R5383 Other fatigue: Secondary | ICD-10-CM | POA: Insufficient documentation

## 2023-10-06 DIAGNOSIS — R519 Headache, unspecified: Secondary | ICD-10-CM | POA: Insufficient documentation

## 2023-10-06 DIAGNOSIS — Y92410 Unspecified street and highway as the place of occurrence of the external cause: Secondary | ICD-10-CM | POA: Insufficient documentation

## 2023-10-06 DIAGNOSIS — R1013 Epigastric pain: Secondary | ICD-10-CM | POA: Diagnosis not present
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
## 2023-10-07 ENCOUNTER — Emergency Department (HOSPITAL_COMMUNITY): Payer: No Typology Code available for payment source

## 2023-10-07 LAB — BASIC METABOLIC PANEL
Anion gap: 9 (ref 5–15)
BUN: 15 mg/dL (ref 6–20)
CO2: 24 mmol/L (ref 22–32)
Calcium: 9.4 mg/dL (ref 8.9–10.3)
Chloride: 109 mmol/L (ref 98–111)
Creatinine, Ser: 1.39 mg/dL — ABNORMAL HIGH (ref 0.61–1.24)
GFR, Estimated: >60 mL/min (ref >60)
Glucose, Bld: 85 mg/dL (ref 70–99)
Potassium: 3.9 mmol/L (ref 3.5–5.1)
Sodium: 142 mmol/L (ref 135–145)

## 2023-10-07 LAB — CBC
HCT: 49 % (ref 39.0–52.0)
Hemoglobin: 15.5 g/dL (ref 13.0–17.0)
MCH: 26.4 pg (ref 26.0–34.0)
MCHC: 31.6 g/dL (ref 30.0–36.0)
MCV: 83.3 fL (ref 80.0–100.0)
Platelets: 286 10*3/uL (ref 150–400)
RBC: 5.88 MIL/uL — ABNORMAL HIGH (ref 4.22–5.81)
RDW: 13.8 % (ref 11.5–15.5)
WBC: 7.4 10*3/uL (ref 4.0–10.5)
nRBC: 0 % (ref 0.0–0.2)

## 2023-10-07 MED ORDER — METHOCARBAMOL 500 MG PO TABS: 500.0000 mg | ORAL_TABLET | Freq: Two times a day (BID) | ORAL | 0 refills | Status: AC

## 2023-10-07 MED ORDER — MORPHINE SULFATE (PF) 4 MG/ML IV SOLN
4.0000 mg | Freq: Once | INTRAVENOUS | Status: AC
  Administered 2023-10-07: 4 mg via INTRAVENOUS
  Filled 2023-10-07: qty 1

## 2023-10-07 MED ORDER — KETOROLAC TROMETHAMINE 30 MG/ML IJ SOLN
30.0000 mg | Freq: Once | INTRAMUSCULAR | Status: AC
  Administered 2023-10-07: 30 mg via INTRAVENOUS
  Filled 2023-10-07: qty 1

## 2023-10-07 MED ORDER — IOHEXOL 350 MG/ML SOLN
75.0000 mL | Freq: Once | INTRAVENOUS | Status: AC | PRN
  Administered 2023-10-07: 75 mL via INTRAVENOUS

## 2023-10-07 NOTE — Discharge Instructions (Addendum)
You were seen in the emergency department today for back pain.   I suspect this is likely caused by muscle injuries.  Rest, gentle exercise and stretching will aid in recovery from any injuries to your back.  Using medication such as Tylenol and ibuprofen will help alleviate pain as well as decrease swelling and inflammation associated with these injuries. You may use 600 mg ibuprofen every 6 hours or 1000 mg of Tylenol every 6 hours.  You may choose to alternate between the 2.  This would be most effective.  Not to exceed 4 g of Tylenol within 24 hours.  Not to exceed 3200 mg ibuprofen 24 hours.  I have prescribed you: a muscle relaxer. Note that muscle relaxers don't alleviate pain on their own, but can help you get to sleep and let the muscles heal.   Salt water/Epson salt soaks, massage, icy hot/Biofreeze/BenGay and other similar products can help with symptoms.  Please return to the emergency department for reevaluation if you denies any new or concerning symptoms such as fever, new numbness or weakness in your legs, difficulty using the restroom or incontinence.

## 2023-10-07 NOTE — ED Notes (Signed)
Pt ambulatory to waiting room. Pt verbalized understanding of discharge instructions.
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
## 2023-10-07 NOTE — ED Notes (Signed)
Update mother please
